# Patient Record
Sex: Female | Born: 1950 | Race: Black or African American | Hispanic: No | Marital: Single
Health system: Southern US, Community
[De-identification: ages and names within clinical notes are randomized; demographics above are authoritative.]

---

## 2004-02-19 ENCOUNTER — Encounter: Payer: Self-pay | Admitting: Anesthesiology

## 2004-03-25 ENCOUNTER — Ambulatory Visit: Payer: Self-pay | Admitting: Nephrology

## 2004-03-30 ENCOUNTER — Ambulatory Visit: Payer: Self-pay | Admitting: Nephrology

## 2004-04-11 ENCOUNTER — Ambulatory Visit: Payer: Self-pay | Admitting: Nephrology

## 2004-04-15 ENCOUNTER — Ambulatory Visit: Payer: Self-pay | Admitting: Nephrology

## 2004-04-20 ENCOUNTER — Ambulatory Visit: Payer: Self-pay | Admitting: Nephrology

## 2004-05-11 ENCOUNTER — Ambulatory Visit: Payer: Self-pay | Admitting: *Deleted

## 2004-06-10 ENCOUNTER — Ambulatory Visit: Payer: Self-pay | Admitting: Internal Medicine

## 2004-06-29 ENCOUNTER — Ambulatory Visit: Payer: Self-pay | Admitting: Nephrology

## 2004-07-11 ENCOUNTER — Ambulatory Visit: Payer: Self-pay | Admitting: Internal Medicine

## 2004-07-27 ENCOUNTER — Ambulatory Visit: Payer: Self-pay | Admitting: Nephrology

## 2004-08-15 ENCOUNTER — Ambulatory Visit: Payer: Self-pay | Admitting: Internal Medicine

## 2004-08-17 ENCOUNTER — Ambulatory Visit: Payer: Self-pay | Admitting: Internal Medicine

## 2004-09-19 ENCOUNTER — Ambulatory Visit: Payer: Self-pay | Admitting: Nephrology

## 2005-02-15 ENCOUNTER — Other Ambulatory Visit: Payer: Self-pay

## 2005-02-15 ENCOUNTER — Emergency Department: Payer: Self-pay | Admitting: General Practice

## 2005-03-20 ENCOUNTER — Ambulatory Visit: Payer: Self-pay | Admitting: Nephrology

## 2005-04-10 ENCOUNTER — Ambulatory Visit: Payer: Self-pay | Admitting: Nephrology

## 2005-07-24 ENCOUNTER — Emergency Department (HOSPITAL_COMMUNITY): Admission: EM | Admit: 2005-07-24 | Discharge: 2005-07-24 | Payer: Self-pay | Admitting: *Deleted

## 2005-08-02 ENCOUNTER — Ambulatory Visit: Payer: Self-pay | Admitting: Nephrology

## 2005-09-12 ENCOUNTER — Ambulatory Visit: Payer: Self-pay | Admitting: Nephrology

## 2006-02-10 ENCOUNTER — Emergency Department: Payer: Self-pay | Admitting: Emergency Medicine

## 2006-12-02 ENCOUNTER — Emergency Department: Payer: Self-pay | Admitting: Emergency Medicine

## 2007-01-27 ENCOUNTER — Ambulatory Visit: Payer: Self-pay | Admitting: Nephrology

## 2007-02-03 ENCOUNTER — Ambulatory Visit: Payer: Self-pay | Admitting: Gastroenterology

## 2007-02-12 ENCOUNTER — Ambulatory Visit: Payer: Self-pay | Admitting: Nephrology

## 2007-07-26 ENCOUNTER — Emergency Department: Payer: Self-pay | Admitting: Internal Medicine

## 2007-08-16 ENCOUNTER — Emergency Department: Payer: Self-pay | Admitting: Emergency Medicine

## 2007-08-21 ENCOUNTER — Ambulatory Visit: Payer: Self-pay | Admitting: Nephrology

## 2007-08-23 ENCOUNTER — Emergency Department: Payer: Self-pay | Admitting: Emergency Medicine

## 2007-09-18 ENCOUNTER — Ambulatory Visit: Payer: Self-pay | Admitting: Nephrology

## 2008-02-10 ENCOUNTER — Ambulatory Visit: Payer: Self-pay | Admitting: Nephrology

## 2008-02-12 ENCOUNTER — Inpatient Hospital Stay: Payer: Self-pay | Admitting: Internal Medicine

## 2008-02-12 ENCOUNTER — Other Ambulatory Visit: Payer: Self-pay

## 2008-02-12 ENCOUNTER — Ambulatory Visit: Payer: Self-pay | Admitting: Cardiovascular Disease

## 2008-02-13 ENCOUNTER — Ambulatory Visit: Payer: Self-pay | Admitting: Cardiovascular Disease

## 2008-03-13 ENCOUNTER — Emergency Department: Payer: Self-pay | Admitting: Emergency Medicine

## 2008-03-14 ENCOUNTER — Emergency Department: Payer: Self-pay | Admitting: Emergency Medicine

## 2008-04-08 ENCOUNTER — Inpatient Hospital Stay: Payer: Self-pay | Admitting: Internal Medicine

## 2008-07-05 ENCOUNTER — Ambulatory Visit: Payer: Self-pay | Admitting: Gastroenterology

## 2008-07-14 ENCOUNTER — Emergency Department: Payer: Self-pay | Admitting: Emergency Medicine

## 2009-01-17 ENCOUNTER — Emergency Department: Payer: Self-pay | Admitting: Emergency Medicine

## 2010-03-16 ENCOUNTER — Ambulatory Visit: Payer: Self-pay | Admitting: Vascular Surgery

## 2010-06-09 ENCOUNTER — Ambulatory Visit: Payer: Self-pay | Admitting: Vascular Surgery

## 2010-07-28 ENCOUNTER — Ambulatory Visit: Payer: Self-pay | Admitting: Nephrology

## 2010-07-28 DIAGNOSIS — I517 Cardiomegaly: Secondary | ICD-10-CM

## 2010-09-22 ENCOUNTER — Emergency Department: Payer: Self-pay | Admitting: Unknown Physician Specialty

## 2010-11-02 ENCOUNTER — Ambulatory Visit: Payer: Self-pay | Admitting: Anesthesiology

## 2010-11-17 ENCOUNTER — Ambulatory Visit: Payer: Self-pay | Admitting: Vascular Surgery

## 2010-11-20 ENCOUNTER — Ambulatory Visit: Payer: Self-pay | Admitting: Anesthesiology

## 2011-06-15 ENCOUNTER — Ambulatory Visit: Payer: Self-pay | Admitting: Vascular Surgery

## 2011-06-15 LAB — POTASSIUM: Potassium: 3.6 mmol/L (ref 3.5–5.1)

## 2011-06-26 ENCOUNTER — Emergency Department: Payer: Self-pay | Admitting: *Deleted

## 2011-06-26 LAB — COMPREHENSIVE METABOLIC PANEL
Albumin: 3.3 g/dL — ABNORMAL LOW (ref 3.4–5.0)
Alkaline Phosphatase: 93 U/L (ref 50–136)
BUN: 24 mg/dL — ABNORMAL HIGH (ref 7–18)
Bilirubin,Total: 0.6 mg/dL (ref 0.2–1.0)
Calcium, Total: 9.2 mg/dL (ref 8.5–10.1)
Chloride: 98 mmol/L (ref 98–107)
Creatinine: 5.08 mg/dL — ABNORMAL HIGH (ref 0.60–1.30)
EGFR (African American): 11 — ABNORMAL LOW
Glucose: 79 mg/dL (ref 65–99)
Osmolality: 281 (ref 275–301)
SGPT (ALT): 6 U/L — ABNORMAL LOW
Sodium: 139 mmol/L (ref 136–145)
Total Protein: 7.6 g/dL (ref 6.4–8.2)

## 2011-06-26 LAB — CBC
HCT: 32.3 % — ABNORMAL LOW (ref 35.0–47.0)
HGB: 10.1 g/dL — ABNORMAL LOW (ref 12.0–16.0)
MCV: 93 fL (ref 80–100)
Platelet: 162 10*3/uL (ref 150–440)
RBC: 3.47 10*6/uL — ABNORMAL LOW (ref 3.80–5.20)
WBC: 5.7 10*3/uL (ref 3.6–11.0)

## 2011-06-26 LAB — TROPONIN I
Troponin-I: 0.52 ng/mL — ABNORMAL HIGH
Troponin-I: 0.5 ng/mL — ABNORMAL HIGH

## 2011-06-26 LAB — CK TOTAL AND CKMB (NOT AT ARMC)
CK, Total: 33 U/L (ref 21–215)
CK-MB: <0.5 ng/mL — ABNORMAL LOW (ref 0.5–3.6)

## 2011-07-11 ENCOUNTER — Emergency Department: Payer: Self-pay | Admitting: Unknown Physician Specialty

## 2011-07-11 LAB — COMPREHENSIVE METABOLIC PANEL
Alkaline Phosphatase: 108 U/L (ref 50–136)
Anion Gap: 12 (ref 7–16)
BUN: 20 mg/dL — ABNORMAL HIGH (ref 7–18)
Bilirubin,Total: 0.6 mg/dL (ref 0.2–1.0)
Calcium, Total: 8 mg/dL — ABNORMAL LOW (ref 8.5–10.1)
Chloride: 100 mmol/L (ref 98–107)
Co2: 32 mmol/L (ref 21–32)
Creatinine: 4.92 mg/dL — ABNORMAL HIGH (ref 0.60–1.30)
EGFR (African American): 12 — ABNORMAL LOW
EGFR (Non-African Amer.): 9 — ABNORMAL LOW
Osmolality: 290 (ref 275–301)
Potassium: 3.4 mmol/L — ABNORMAL LOW (ref 3.5–5.1)
SGOT(AST): 11 U/L — ABNORMAL LOW (ref 15–37)
SGPT (ALT): 8 U/L — ABNORMAL LOW

## 2011-07-11 LAB — CBC
MCHC: 31.2 g/dL — ABNORMAL LOW (ref 32.0–36.0)
Platelet: 131 10*3/uL — ABNORMAL LOW (ref 150–440)
RDW: 16.8 % — ABNORMAL HIGH (ref 11.5–14.5)

## 2011-08-15 ENCOUNTER — Emergency Department: Payer: Self-pay

## 2011-08-20 ENCOUNTER — Observation Stay: Payer: Self-pay | Admitting: Specialist

## 2011-08-20 LAB — CBC WITH DIFFERENTIAL/PLATELET
Basophil %: 0.5 %
Eosinophil #: 0.1 10*3/uL (ref 0.0–0.7)
HCT: 30.4 % — ABNORMAL LOW (ref 35.0–47.0)
HGB: 9.4 g/dL — ABNORMAL LOW (ref 12.0–16.0)
Lymphocyte %: 14.2 %
MCH: 28.5 pg (ref 26.0–34.0)
MCHC: 30.9 g/dL — ABNORMAL LOW (ref 32.0–36.0)
MCV: 92 fL (ref 80–100)
Monocyte #: 0.5 10*3/uL (ref 0.0–0.7)
Monocyte %: 8.6 %
Neutrophil %: 74.7 %
RBC: 3.3 10*6/uL — ABNORMAL LOW (ref 3.80–5.20)
RDW: 16.8 % — ABNORMAL HIGH (ref 11.5–14.5)
WBC: 5.5 10*3/uL (ref 3.6–11.0)

## 2011-08-20 LAB — COMPREHENSIVE METABOLIC PANEL
Albumin: 3.5 g/dL (ref 3.4–5.0)
Alkaline Phosphatase: 118 U/L (ref 50–136)
Anion Gap: 10 (ref 7–16)
BUN: 33 mg/dL — ABNORMAL HIGH (ref 7–18)
Bilirubin,Total: 1 mg/dL (ref 0.2–1.0)
Calcium, Total: 9.5 mg/dL (ref 8.5–10.1)
Creatinine: 5.47 mg/dL — ABNORMAL HIGH (ref 0.60–1.30)
EGFR (Non-African Amer.): 8 — ABNORMAL LOW
Glucose: 182 mg/dL — ABNORMAL HIGH (ref 65–99)
Potassium: 4.2 mmol/L (ref 3.5–5.1)
SGPT (ALT): 13 U/L
Total Protein: 7.9 g/dL (ref 6.4–8.2)

## 2011-08-20 LAB — IRON AND TIBC
Iron Bind.Cap.(Total): 121 ug/dL — ABNORMAL LOW (ref 250–450)
Iron Saturation: 45 %

## 2011-08-20 LAB — FERRITIN: Ferritin (ARMC): 1807 ng/mL — ABNORMAL HIGH (ref 8–388)

## 2011-08-21 LAB — CBC WITH DIFFERENTIAL/PLATELET
Basophil #: 0 10*3/uL (ref 0.0–0.1)
Basophil %: 0.1 %
Eosinophil %: 0.9 %
HCT: 27.3 % — ABNORMAL LOW (ref 35.0–47.0)
HGB: 8.6 g/dL — ABNORMAL LOW (ref 12.0–16.0)
Lymphocyte #: 0.8 10*3/uL — ABNORMAL LOW (ref 1.0–3.6)
MCH: 29.1 pg (ref 26.0–34.0)
MCV: 93 fL (ref 80–100)
Monocyte #: 0.4 10*3/uL (ref 0.0–0.7)
Monocyte %: 7.2 %
Neutrophil %: 76.3 %
RBC: 2.95 10*6/uL — ABNORMAL LOW (ref 3.80–5.20)
RDW: 17.8 % — ABNORMAL HIGH (ref 11.5–14.5)
WBC: 5.2 10*3/uL (ref 3.6–11.0)

## 2011-08-21 LAB — RENAL FUNCTION PANEL
BUN: 43 mg/dL — ABNORMAL HIGH (ref 7–18)
Chloride: 97 mmol/L — ABNORMAL LOW (ref 98–107)
Creatinine: 6.93 mg/dL — ABNORMAL HIGH (ref 0.60–1.30)
EGFR (Non-African Amer.): 6 — ABNORMAL LOW
Glucose: 157 mg/dL — ABNORMAL HIGH (ref 65–99)

## 2011-08-25 ENCOUNTER — Ambulatory Visit: Payer: Self-pay | Admitting: Vascular Surgery

## 2011-08-25 LAB — POTASSIUM: Potassium: 5 mmol/L (ref 3.5–5.1)

## 2011-11-05 ENCOUNTER — Ambulatory Visit: Payer: Self-pay | Admitting: Vascular Surgery

## 2012-01-15 ENCOUNTER — Inpatient Hospital Stay: Payer: Self-pay | Admitting: Internal Medicine

## 2012-01-15 LAB — COMPREHENSIVE METABOLIC PANEL
Alkaline Phosphatase: 98 U/L (ref 50–136)
Anion Gap: 11 (ref 7–16)
BUN: 52 mg/dL — ABNORMAL HIGH (ref 7–18)
Chloride: 98 mmol/L (ref 98–107)
Creatinine: 7.1 mg/dL — ABNORMAL HIGH (ref 0.60–1.30)
EGFR (African American): 7 — ABNORMAL LOW
SGPT (ALT): 16 U/L (ref 12–78)
Total Protein: 8.2 g/dL (ref 6.4–8.2)

## 2012-01-15 LAB — CBC WITH DIFFERENTIAL/PLATELET
Basophil #: 0 10*3/uL (ref 0.0–0.1)
Eosinophil #: 0 10*3/uL (ref 0.0–0.7)
Eosinophil %: 0 %
HGB: 11.6 g/dL — ABNORMAL LOW (ref 12.0–16.0)
Monocyte #: 0.4 x10 3/mm (ref 0.2–0.9)
Neutrophil %: 84.9 %
RBC: 3.92 10*6/uL (ref 3.80–5.20)
WBC: 6.1 10*3/uL (ref 3.6–11.0)

## 2012-01-15 LAB — LIPID PANEL
HDL Cholesterol: 68 mg/dL — ABNORMAL HIGH (ref 40–60)
Ldl Cholesterol, Calc: 64 mg/dL (ref 0–100)
VLDL Cholesterol, Calc: 18 mg/dL (ref 5–40)

## 2012-01-15 LAB — T4, FREE: Free Thyroxine: 1.25 ng/dL (ref 0.76–1.46)

## 2012-01-15 LAB — TSH: Thyroid Stimulating Horm: 0.324 u[IU]/mL — ABNORMAL LOW

## 2012-01-16 DIAGNOSIS — R748 Abnormal levels of other serum enzymes: Secondary | ICD-10-CM

## 2012-01-16 LAB — CBC WITH DIFFERENTIAL/PLATELET
Basophil #: 0 10*3/uL (ref 0.0–0.1)
Basophil %: 0.6 %
Eosinophil #: 0 10*3/uL (ref 0.0–0.7)
Lymphocyte #: 0.5 10*3/uL — ABNORMAL LOW (ref 1.0–3.6)
Lymphocyte %: 8.6 %
MCH: 29.4 pg (ref 26.0–34.0)
MCHC: 31.9 g/dL — ABNORMAL LOW (ref 32.0–36.0)
Monocyte #: 0.5 x10 3/mm (ref 0.2–0.9)
Monocyte %: 7.9 %
Neutrophil %: 82.9 %
RBC: 3.56 10*6/uL — ABNORMAL LOW (ref 3.80–5.20)
RDW: 17.8 % — ABNORMAL HIGH (ref 11.5–14.5)

## 2012-01-16 LAB — TROPONIN I
Troponin-I: 2.15 ng/mL — ABNORMAL HIGH
Troponin-I: 2.4 ng/mL — ABNORMAL HIGH

## 2012-01-16 LAB — COMPREHENSIVE METABOLIC PANEL
Albumin: 2.9 g/dL — ABNORMAL LOW (ref 3.4–5.0)
Alkaline Phosphatase: 85 U/L (ref 50–136)
Anion Gap: 13 (ref 7–16)
BUN: 55 mg/dL — ABNORMAL HIGH (ref 7–18)
Calcium, Total: 9.9 mg/dL (ref 8.5–10.1)
Co2: 25 mmol/L (ref 21–32)
EGFR (Non-African Amer.): 5 — ABNORMAL LOW
Glucose: 173 mg/dL — ABNORMAL HIGH (ref 65–99)
Osmolality: 287 (ref 275–301)
SGOT(AST): 30 U/L (ref 15–37)
SGPT (ALT): 15 U/L (ref 12–78)
Sodium: 134 mmol/L — ABNORMAL LOW (ref 136–145)

## 2012-01-16 LAB — CK TOTAL AND CKMB (NOT AT ARMC): CK-MB: <0.5 ng/mL — ABNORMAL LOW (ref 0.5–3.6)

## 2012-01-17 LAB — CLOSTRIDIUM DIFFICILE BY PCR

## 2012-01-17 LAB — HEMOGLOBIN: HGB: 10.1 g/dL — ABNORMAL LOW (ref 12.0–16.0)

## 2012-01-18 LAB — VANCOMYCIN, TROUGH: Vancomycin, Trough: 19 ug/mL (ref 10–20)

## 2012-01-19 LAB — PHOSPHORUS: Phosphorus: 6.2 mg/dL — ABNORMAL HIGH (ref 2.5–4.9)

## 2012-01-20 LAB — CULTURE, BLOOD (SINGLE)

## 2012-01-21 LAB — RENAL FUNCTION PANEL
Anion Gap: 8 (ref 7–16)
BUN: 35 mg/dL — ABNORMAL HIGH (ref 7–18)
Calcium, Total: 9.9 mg/dL (ref 8.5–10.1)
Chloride: 100 mmol/L (ref 98–107)
EGFR (African American): 8 — ABNORMAL LOW
EGFR (Non-African Amer.): 7 — ABNORMAL LOW
Glucose: 263 mg/dL — ABNORMAL HIGH (ref 65–99)
Osmolality: 293 (ref 275–301)
Sodium: 138 mmol/L (ref 136–145)

## 2012-01-21 LAB — CBC WITH DIFFERENTIAL/PLATELET
Basophil %: 1 %
Eosinophil %: 1.9 %
HCT: 35.1 % (ref 35.0–47.0)
Lymphocyte #: 1.1 10*3/uL (ref 1.0–3.6)
MCH: 29.3 pg (ref 26.0–34.0)
MCV: 95 fL (ref 80–100)
Monocyte #: 0.5 x10 3/mm (ref 0.2–0.9)
Platelet: 201 10*3/uL (ref 150–440)

## 2012-01-21 LAB — CULTURE, BLOOD (SINGLE)

## 2012-01-22 LAB — RENAL FUNCTION PANEL
Albumin: 2.6 g/dL — ABNORMAL LOW (ref 3.4–5.0)
Anion Gap: 8 (ref 7–16)
BUN: 43 mg/dL — ABNORMAL HIGH (ref 7–18)
Calcium, Total: 9.5 mg/dL (ref 8.5–10.1)
Chloride: 102 mmol/L (ref 98–107)
Co2: 27 mmol/L (ref 21–32)
Creatinine: 6.65 mg/dL — ABNORMAL HIGH (ref 0.60–1.30)
EGFR (African American): 7 — ABNORMAL LOW
EGFR (Non-African Amer.): 6 — ABNORMAL LOW
Glucose: 326 mg/dL — ABNORMAL HIGH (ref 65–99)
Osmolality: 297 (ref 275–301)
Phosphorus: 4.7 mg/dL (ref 2.5–4.9)
Potassium: 4.5 mmol/L (ref 3.5–5.1)
Sodium: 137 mmol/L (ref 136–145)

## 2012-01-22 LAB — PHOSPHORUS: Phosphorus: 4.8 mg/dL (ref 2.5–4.9)

## 2012-01-24 ENCOUNTER — Ambulatory Visit: Payer: Self-pay | Admitting: Vascular Surgery

## 2012-01-24 LAB — CBC WITH DIFFERENTIAL/PLATELET
Bands: 1 %
Eosinophil: 1 %
HGB: 10 g/dL — ABNORMAL LOW (ref 12.0–16.0)
Lymphocytes: 25 %
MCH: 29.1 pg (ref 26.0–34.0)
MCHC: 31.3 g/dL — ABNORMAL LOW (ref 32.0–36.0)
MCV: 93 fL (ref 80–100)
Monocytes: 10 %
Myelocyte: 1 %
RBC: 3.45 10*6/uL — ABNORMAL LOW (ref 3.80–5.20)
RDW: 17.4 % — ABNORMAL HIGH (ref 11.5–14.5)
Segmented Neutrophils: 61 %

## 2012-01-24 LAB — PHOSPHORUS: Phosphorus: 5.5 mg/dL — ABNORMAL HIGH (ref 2.5–4.9)

## 2012-01-25 LAB — PHOSPHORUS: Phosphorus: 6.8 mg/dL — ABNORMAL HIGH (ref 2.5–4.9)

## 2012-04-22 ENCOUNTER — Inpatient Hospital Stay: Payer: Self-pay | Admitting: Internal Medicine

## 2012-04-22 LAB — COMPREHENSIVE METABOLIC PANEL
Alkaline Phosphatase: 231 U/L — ABNORMAL HIGH (ref 50–136)
Anion Gap: 10 (ref 7–16)
Bilirubin,Total: 0.8 mg/dL (ref 0.2–1.0)
Calcium, Total: 8.1 mg/dL — ABNORMAL LOW (ref 8.5–10.1)
Chloride: 97 mmol/L — ABNORMAL LOW (ref 98–107)
Co2: 29 mmol/L (ref 21–32)
EGFR (African American): 16 — ABNORMAL LOW
EGFR (Non-African Amer.): 14 — ABNORMAL LOW
Glucose: 91 mg/dL (ref 65–99)
Osmolality: 273 (ref 275–301)
Potassium: 3.2 mmol/L — ABNORMAL LOW (ref 3.5–5.1)
Sodium: 136 mmol/L (ref 136–145)

## 2012-04-22 LAB — CBC
MCHC: 32.1 g/dL (ref 32.0–36.0)
Platelet: 89 10*3/uL — ABNORMAL LOW (ref 150–440)
RDW: 17.6 % — ABNORMAL HIGH (ref 11.5–14.5)
WBC: 8.1 10*3/uL (ref 3.6–11.0)

## 2012-04-22 LAB — LIPASE, BLOOD: Lipase: 156 U/L (ref 73–393)

## 2012-04-23 LAB — CBC WITH DIFFERENTIAL/PLATELET
Basophil #: 0.1 10*3/uL (ref 0.0–0.1)
Basophil %: 0.6 %
Eosinophil #: 0.1 10*3/uL (ref 0.0–0.7)
HCT: 31.6 % — ABNORMAL LOW (ref 35.0–47.0)
HGB: 9.8 g/dL — ABNORMAL LOW (ref 12.0–16.0)
Lymphocyte %: 6.5 %
MCHC: 30.8 g/dL — ABNORMAL LOW (ref 32.0–36.0)
Monocyte #: 0.5 x10 3/mm (ref 0.2–0.9)
WBC: 8.7 10*3/uL (ref 3.6–11.0)

## 2012-04-23 LAB — BASIC METABOLIC PANEL
BUN: 26 mg/dL — ABNORMAL HIGH (ref 7–18)
Calcium, Total: 8.1 mg/dL — ABNORMAL LOW (ref 8.5–10.1)
Chloride: 102 mmol/L (ref 98–107)
Creatinine: 4.19 mg/dL — ABNORMAL HIGH (ref 0.60–1.30)
EGFR (African American): 12 — ABNORMAL LOW
EGFR (Non-African Amer.): 11 — ABNORMAL LOW
Glucose: 127 mg/dL — ABNORMAL HIGH (ref 65–99)
Osmolality: 282 (ref 275–301)
Sodium: 138 mmol/L (ref 136–145)

## 2012-04-23 LAB — PHOSPHORUS: Phosphorus: 4.7 mg/dL (ref 2.5–4.9)

## 2012-04-23 LAB — MAGNESIUM: Magnesium: 2.3 mg/dL

## 2012-04-24 LAB — COMPREHENSIVE METABOLIC PANEL
Albumin: 2.6 g/dL — ABNORMAL LOW (ref 3.4–5.0)
Alkaline Phosphatase: 192 U/L — ABNORMAL HIGH (ref 50–136)
Calcium, Total: 7.6 mg/dL — ABNORMAL LOW (ref 8.5–10.1)
Glucose: 125 mg/dL — ABNORMAL HIGH (ref 65–99)
Potassium: 4.1 mmol/L (ref 3.5–5.1)
SGOT(AST): 97 U/L — ABNORMAL HIGH (ref 15–37)
SGPT (ALT): 80 U/L — ABNORMAL HIGH (ref 12–78)
Total Protein: 7 g/dL (ref 6.4–8.2)

## 2012-04-24 LAB — CANCER ANTIGEN 19-9: CA 19-9: 59 U/mL — ABNORMAL HIGH (ref 0–35)

## 2012-04-24 LAB — CEA: CEA: 3.3 ng/mL (ref 0.0–4.7)

## 2012-04-24 LAB — HEPATIC FUNCTION PANEL A (ARMC): Bilirubin, Direct: 0.2 mg/dL (ref 0.00–0.20)

## 2012-04-24 LAB — PROTIME-INR: Prothrombin Time: 16.7 secs — ABNORMAL HIGH (ref 11.5–14.7)

## 2012-04-24 LAB — LIPASE, BLOOD: Lipase: 150 U/L (ref 73–393)

## 2012-04-25 LAB — CBC WITH DIFFERENTIAL/PLATELET
Basophil %: 0.7 %
Eosinophil #: 0.1 10*3/uL (ref 0.0–0.7)
Eosinophil %: 0.7 %
HCT: 33.3 % — ABNORMAL LOW (ref 35.0–47.0)
HGB: 10.4 g/dL — ABNORMAL LOW (ref 12.0–16.0)
Lymphocyte #: 0.6 10*3/uL — ABNORMAL LOW (ref 1.0–3.6)
Lymphocyte %: 7 %
MCH: 27.6 pg (ref 26.0–34.0)
MCHC: 31.3 g/dL — ABNORMAL LOW (ref 32.0–36.0)
MCV: 88 fL (ref 80–100)
Monocyte %: 5.4 %
Neutrophil #: 7.2 10*3/uL — ABNORMAL HIGH (ref 1.4–6.5)
RBC: 3.77 10*6/uL — ABNORMAL LOW (ref 3.80–5.20)
RDW: 17.6 % — ABNORMAL HIGH (ref 11.5–14.5)
WBC: 8.3 10*3/uL (ref 3.6–11.0)

## 2012-04-25 LAB — HEPATIC FUNCTION PANEL A (ARMC)
Albumin: 2.5 g/dL — ABNORMAL LOW (ref 3.4–5.0)
Bilirubin, Direct: 0.2 mg/dL (ref 0.00–0.20)
Bilirubin,Total: 0.5 mg/dL (ref 0.2–1.0)
SGPT (ALT): 57 U/L (ref 12–78)
Total Protein: 7.1 g/dL (ref 6.4–8.2)

## 2012-04-25 LAB — PROTIME-INR: INR: 1.3

## 2012-04-26 LAB — PROTIME-INR: Prothrombin Time: 16.5 secs — ABNORMAL HIGH (ref 11.5–14.7)

## 2012-04-26 LAB — RENAL FUNCTION PANEL
Anion Gap: 8 (ref 7–16)
Calcium, Total: 7.6 mg/dL — ABNORMAL LOW (ref 8.5–10.1)
Co2: 27 mmol/L (ref 21–32)
Glucose: 168 mg/dL — ABNORMAL HIGH (ref 65–99)
Osmolality: 285 (ref 275–301)
Phosphorus: 3.9 mg/dL (ref 2.5–4.9)
Potassium: 3.7 mmol/L (ref 3.5–5.1)
Sodium: 139 mmol/L (ref 136–145)

## 2012-04-26 LAB — CBC WITH DIFFERENTIAL/PLATELET
Basophil %: 1.2 %
Eosinophil #: 0.1 10*3/uL (ref 0.0–0.7)
Eosinophil %: 2 %
HCT: 31.7 % — ABNORMAL LOW (ref 35.0–47.0)
HGB: 9.8 g/dL — ABNORMAL LOW (ref 12.0–16.0)
Lymphocyte %: 13.9 %
MCHC: 31 g/dL — ABNORMAL LOW (ref 32.0–36.0)
Monocyte %: 6.3 %
Neutrophil #: 4.3 10*3/uL (ref 1.4–6.5)
Neutrophil %: 76.6 %
RBC: 3.62 10*6/uL — ABNORMAL LOW (ref 3.80–5.20)
RDW: 17.7 % — ABNORMAL HIGH (ref 11.5–14.5)

## 2012-04-27 LAB — CLOSTRIDIUM DIFFICILE BY PCR

## 2012-04-28 LAB — CULTURE, BLOOD (SINGLE)

## 2012-05-03 ENCOUNTER — Emergency Department: Payer: Self-pay | Admitting: Emergency Medicine

## 2012-05-03 LAB — CBC
HCT: 31.2 % — ABNORMAL LOW (ref 35.0–47.0)
HGB: 9.6 g/dL — ABNORMAL LOW (ref 12.0–16.0)
MCHC: 30.8 g/dL — ABNORMAL LOW (ref 32.0–36.0)
MCV: 89 fL (ref 80–100)
RBC: 3.52 10*6/uL — ABNORMAL LOW (ref 3.80–5.20)
WBC: 8.1 10*3/uL (ref 3.6–11.0)

## 2012-05-03 LAB — COMPREHENSIVE METABOLIC PANEL
Albumin: 2.7 g/dL — ABNORMAL LOW (ref 3.4–5.0)
Alkaline Phosphatase: 156 U/L — ABNORMAL HIGH (ref 50–136)
Anion Gap: 12 (ref 7–16)
BUN: 19 mg/dL — ABNORMAL HIGH (ref 7–18)
Bilirubin,Total: 0.5 mg/dL (ref 0.2–1.0)
Calcium, Total: 8.2 mg/dL — ABNORMAL LOW (ref 8.5–10.1)
Co2: 23 mmol/L (ref 21–32)
Creatinine: 4.14 mg/dL — ABNORMAL HIGH (ref 0.60–1.30)
EGFR (Non-African Amer.): 11 — ABNORMAL LOW
Osmolality: 280 (ref 275–301)
Potassium: 4.3 mmol/L (ref 3.5–5.1)
Sodium: 139 mmol/L (ref 136–145)
Total Protein: 7.7 g/dL (ref 6.4–8.2)

## 2012-05-03 LAB — TROPONIN I: Troponin-I: 0.25 ng/mL — ABNORMAL HIGH

## 2012-05-04 ENCOUNTER — Inpatient Hospital Stay: Payer: Self-pay | Admitting: Internal Medicine

## 2012-05-04 LAB — COMPREHENSIVE METABOLIC PANEL
Albumin: 2.7 g/dL — ABNORMAL LOW (ref 3.4–5.0)
Anion Gap: 9 (ref 7–16)
BUN: 35 mg/dL — ABNORMAL HIGH (ref 7–18)
Bilirubin,Total: 0.3 mg/dL (ref 0.2–1.0)
Chloride: 108 mmol/L — ABNORMAL HIGH (ref 98–107)
Creatinine: 6.3 mg/dL — ABNORMAL HIGH (ref 0.60–1.30)
EGFR (Non-African Amer.): 6 — ABNORMAL LOW
Glucose: 132 mg/dL — ABNORMAL HIGH (ref 65–99)
Osmolality: 287 (ref 275–301)
Potassium: 5.3 mmol/L — ABNORMAL HIGH (ref 3.5–5.1)
Sodium: 139 mmol/L (ref 136–145)

## 2012-05-04 LAB — CBC
HCT: 31.3 % — ABNORMAL LOW
HGB: 9.7 g/dL — ABNORMAL LOW
MCH: 27.4 pg
MCHC: 31 g/dL — ABNORMAL LOW
MCV: 88 fL
Platelet: 128 10*3/uL — ABNORMAL LOW
RBC: 3.54 X10 6/mm 3 — ABNORMAL LOW
RDW: 18.5 % — ABNORMAL HIGH
WBC: 8.4 10*3/uL

## 2012-05-05 LAB — CULTURE, BLOOD (SINGLE)

## 2012-05-05 LAB — HEMOGLOBIN A1C: Hemoglobin A1C: 6.8 % — ABNORMAL HIGH (ref 4.2–6.3)

## 2012-05-07 LAB — CLOSTRIDIUM DIFFICILE BY PCR

## 2012-05-08 LAB — BASIC METABOLIC PANEL
Anion Gap: 9 (ref 7–16)
Calcium, Total: 7.3 mg/dL — ABNORMAL LOW (ref 8.5–10.1)
Chloride: 105 mmol/L (ref 98–107)
Co2: 22 mmol/L (ref 21–32)
EGFR (African American): 6 — ABNORMAL LOW
EGFR (Non-African Amer.): 5 — ABNORMAL LOW
Glucose: 131 mg/dL — ABNORMAL HIGH (ref 65–99)
Osmolality: 286 (ref 275–301)
Sodium: 136 mmol/L (ref 136–145)

## 2012-05-08 LAB — PHOSPHORUS: Phosphorus: 5.7 mg/dL — ABNORMAL HIGH (ref 2.5–4.9)

## 2012-05-10 LAB — CULTURE, BLOOD (SINGLE)

## 2012-05-11 LAB — CULTURE, BLOOD (SINGLE)

## 2012-06-02 ENCOUNTER — Ambulatory Visit: Payer: Self-pay | Admitting: Vascular Surgery

## 2012-06-05 LAB — AEROBIC CULTURE

## 2012-06-10 ENCOUNTER — Ambulatory Visit: Payer: Self-pay | Admitting: Vascular Surgery

## 2012-06-11 ENCOUNTER — Emergency Department: Payer: Self-pay

## 2012-07-04 ENCOUNTER — Ambulatory Visit: Payer: Self-pay | Admitting: Hematology and Oncology

## 2012-07-15 ENCOUNTER — Emergency Department: Payer: Self-pay | Admitting: Emergency Medicine

## 2012-07-15 LAB — CBC
HGB: 11.4 g/dL — ABNORMAL LOW (ref 12.0–16.0)
MCH: 24.4 pg — ABNORMAL LOW (ref 26.0–34.0)
MCHC: 29.5 g/dL — ABNORMAL LOW (ref 32.0–36.0)
MCV: 83 fL (ref 80–100)
RBC: 4.66 10*6/uL (ref 3.80–5.20)
RDW: 22.7 % — ABNORMAL HIGH (ref 11.5–14.5)
WBC: 6.4 10*3/uL (ref 3.6–11.0)

## 2012-07-15 LAB — COMPREHENSIVE METABOLIC PANEL
Anion Gap: 11 (ref 7–16)
BUN: 48 mg/dL — ABNORMAL HIGH (ref 7–18)
Bilirubin,Total: 0.5 mg/dL (ref 0.2–1.0)
Calcium, Total: 8.1 mg/dL — ABNORMAL LOW (ref 8.5–10.1)
Chloride: 104 mmol/L (ref 98–107)
Co2: 23 mmol/L (ref 21–32)
Creatinine: 5.73 mg/dL — ABNORMAL HIGH (ref 0.60–1.30)
EGFR (African American): 8 — ABNORMAL LOW
EGFR (Non-African Amer.): 7 — ABNORMAL LOW
Osmolality: 289 (ref 275–301)
Potassium: 4.6 mmol/L (ref 3.5–5.1)
SGOT(AST): 13 U/L — ABNORMAL LOW (ref 15–37)
SGPT (ALT): <6 U/L — ABNORMAL LOW (ref 12–78)

## 2012-07-19 ENCOUNTER — Emergency Department: Payer: Self-pay | Admitting: Emergency Medicine

## 2012-07-19 ENCOUNTER — Ambulatory Visit: Payer: Self-pay | Admitting: Internal Medicine

## 2012-07-19 LAB — CBC
HGB: 9.9 g/dL — ABNORMAL LOW (ref 12.0–16.0)
MCHC: 29 g/dL — ABNORMAL LOW (ref 32.0–36.0)
RBC: 4.15 10*6/uL (ref 3.80–5.20)
RDW: 23 % — ABNORMAL HIGH (ref 11.5–14.5)

## 2012-07-19 LAB — COMPREHENSIVE METABOLIC PANEL
Bilirubin,Total: 0.6 mg/dL (ref 0.2–1.0)
Calcium, Total: 9 mg/dL (ref 8.5–10.1)
Co2: 27 mmol/L (ref 21–32)
Creatinine: 4.76 mg/dL — ABNORMAL HIGH (ref 0.60–1.30)
EGFR (African American): 11 — ABNORMAL LOW
EGFR (Non-African Amer.): 9 — ABNORMAL LOW
Glucose: 112 mg/dL — ABNORMAL HIGH (ref 65–99)
Osmolality: 294 (ref 275–301)
Potassium: 3.8 mmol/L (ref 3.5–5.1)
SGOT(AST): 15 U/L (ref 15–37)
SGPT (ALT): 7 U/L — ABNORMAL LOW (ref 12–78)
Sodium: 143 mmol/L (ref 136–145)
Total Protein: 7.7 g/dL (ref 6.4–8.2)

## 2012-07-23 ENCOUNTER — Emergency Department: Payer: Self-pay | Admitting: Internal Medicine

## 2012-07-23 LAB — CBC
MCV: 80 fL (ref 80–100)
Platelet: 166 10*3/uL (ref 150–440)
RBC: 4.06 10*6/uL (ref 3.80–5.20)
RDW: 22.4 % — ABNORMAL HIGH (ref 11.5–14.5)
WBC: 13.1 10*3/uL — ABNORMAL HIGH (ref 3.6–11.0)

## 2012-07-23 LAB — COMPREHENSIVE METABOLIC PANEL
Albumin: 1.9 g/dL — ABNORMAL LOW (ref 3.4–5.0)
Anion Gap: 7 (ref 7–16)
BUN: 46 mg/dL — ABNORMAL HIGH (ref 7–18)
Bilirubin,Total: 0.5 mg/dL (ref 0.2–1.0)
Calcium, Total: 8.5 mg/dL (ref 8.5–10.1)
Chloride: 103 mmol/L (ref 98–107)
Co2: 27 mmol/L (ref 21–32)
Creatinine: 5.93 mg/dL — ABNORMAL HIGH (ref 0.60–1.30)
EGFR (African American): 8 — ABNORMAL LOW
EGFR (Non-African Amer.): 7 — ABNORMAL LOW
SGPT (ALT): 8 U/L — ABNORMAL LOW (ref 12–78)
Sodium: 137 mmol/L (ref 136–145)
Total Protein: 6.8 g/dL (ref 6.4–8.2)

## 2012-07-25 ENCOUNTER — Inpatient Hospital Stay: Payer: Self-pay | Admitting: Internal Medicine

## 2012-07-25 LAB — BASIC METABOLIC PANEL
BUN: 20 mg/dL — ABNORMAL HIGH (ref 7–18)
Calcium, Total: 8.8 mg/dL (ref 8.5–10.1)
Co2: 27 mmol/L (ref 21–32)
Creatinine: 3.35 mg/dL — ABNORMAL HIGH (ref 0.60–1.30)
EGFR (African American): 16 — ABNORMAL LOW
EGFR (Non-African Amer.): 14 — ABNORMAL LOW
Osmolality: 276 (ref 275–301)
Potassium: 4.7 mmol/L (ref 3.5–5.1)
Sodium: 137 mmol/L (ref 136–145)

## 2012-07-25 LAB — CBC
HCT: 34.3 % — ABNORMAL LOW (ref 35.0–47.0)
MCHC: 28.4 g/dL — ABNORMAL LOW (ref 32.0–36.0)
RBC: 4.27 10*6/uL (ref 3.80–5.20)
RDW: 23.1 % — ABNORMAL HIGH (ref 11.5–14.5)
WBC: 27.2 10*3/uL — ABNORMAL HIGH (ref 3.6–11.0)

## 2012-07-25 LAB — HEMOGLOBIN A1C: Hemoglobin A1C: 4.8 % (ref 4.2–6.3)

## 2012-07-25 LAB — CULTURE, BLOOD (SINGLE)

## 2012-07-26 ENCOUNTER — Ambulatory Visit: Payer: Self-pay | Admitting: Orthopedic Surgery

## 2012-07-26 LAB — CBC WITH DIFFERENTIAL/PLATELET
Eosinophil #: 0 10*3/uL (ref 0.0–0.7)
Eosinophil %: 0 %
HCT: 32.6 % — ABNORMAL LOW (ref 35.0–47.0)
Lymphocyte #: 0.6 10*3/uL — ABNORMAL LOW (ref 1.0–3.6)
MCHC: 28.7 g/dL — ABNORMAL LOW (ref 32.0–36.0)
MCV: 79 fL — ABNORMAL LOW (ref 80–100)
Monocyte %: 1.7 %
Platelet: 199 10*3/uL (ref 150–440)
RBC: 4.11 10*6/uL (ref 3.80–5.20)
RDW: 22.9 % — ABNORMAL HIGH (ref 11.5–14.5)
WBC: 29.2 10*3/uL — ABNORMAL HIGH (ref 3.6–11.0)

## 2012-07-26 LAB — BASIC METABOLIC PANEL
BUN: 26 mg/dL — ABNORMAL HIGH (ref 7–18)
Calcium, Total: 9 mg/dL (ref 8.5–10.1)
Chloride: 103 mmol/L (ref 98–107)
Creatinine: 3.99 mg/dL — ABNORMAL HIGH (ref 0.60–1.30)
EGFR (African American): 13 — ABNORMAL LOW
EGFR (Non-African Amer.): 11 — ABNORMAL LOW
Sodium: 137 mmol/L (ref 136–145)

## 2012-07-26 LAB — WBCS, STOOL

## 2012-07-27 LAB — CBC WITH DIFFERENTIAL/PLATELET
Basophil #: 0.1 10*3/uL (ref 0.0–0.1)
Eosinophil #: 0.1 10*3/uL (ref 0.0–0.7)
HCT: 31.3 % — ABNORMAL LOW (ref 35.0–47.0)
HGB: 9.1 g/dL — ABNORMAL LOW (ref 12.0–16.0)
Lymphocyte %: 2.4 %
MCH: 23.4 pg — ABNORMAL LOW (ref 26.0–34.0)
MCHC: 29.1 g/dL — ABNORMAL LOW (ref 32.0–36.0)
MCV: 81 fL (ref 80–100)
Monocyte #: 0.7 x10 3/mm (ref 0.2–0.9)
Monocyte %: 2.1 %
Platelet: 272 10*3/uL (ref 150–440)
RBC: 3.89 10*6/uL (ref 3.80–5.20)
RDW: 23.4 % — ABNORMAL HIGH (ref 11.5–14.5)

## 2012-07-27 LAB — BASIC METABOLIC PANEL
Anion Gap: 17 — ABNORMAL HIGH (ref 7–16)
BUN: 32 mg/dL — ABNORMAL HIGH (ref 7–18)
Calcium, Total: 9.1 mg/dL (ref 8.5–10.1)
Co2: 16 mmol/L — ABNORMAL LOW (ref 21–32)
EGFR (Non-African Amer.): 10 — ABNORMAL LOW
Glucose: 192 mg/dL — ABNORMAL HIGH (ref 65–99)
Potassium: 3.9 mmol/L (ref 3.5–5.1)

## 2012-07-28 LAB — BASIC METABOLIC PANEL
BUN: 36 mg/dL — ABNORMAL HIGH (ref 7–18)
Calcium, Total: 9 mg/dL (ref 8.5–10.1)
Chloride: 102 mmol/L (ref 98–107)
Creatinine: 5.05 mg/dL — ABNORMAL HIGH (ref 0.60–1.30)
EGFR (African American): 10 — ABNORMAL LOW
EGFR (Non-African Amer.): 8 — ABNORMAL LOW
Potassium: 3.3 mmol/L — ABNORMAL LOW (ref 3.5–5.1)

## 2012-07-28 LAB — CBC WITH DIFFERENTIAL/PLATELET
Basophil #: 0.1 10*3/uL (ref 0.0–0.1)
Basophil %: 0.5 %
Eosinophil #: 0.1 10*3/uL (ref 0.0–0.7)
Eosinophil %: 0.7 %
HCT: 27.2 % — ABNORMAL LOW (ref 35.0–47.0)
HGB: 7.9 g/dL — ABNORMAL LOW (ref 12.0–16.0)
Lymphocyte #: 0.8 10*3/uL — ABNORMAL LOW (ref 1.0–3.6)
Lymphocyte %: 4.7 %
MCHC: 28.9 g/dL — ABNORMAL LOW (ref 32.0–36.0)
Monocyte %: 2.7 %
Neutrophil %: 91.4 %
Platelet: 239 10*3/uL (ref 150–440)
RBC: 3.53 10*6/uL — ABNORMAL LOW (ref 3.80–5.20)
RDW: 22.4 % — ABNORMAL HIGH (ref 11.5–14.5)
WBC: 17.8 10*3/uL — ABNORMAL HIGH (ref 3.6–11.0)

## 2012-07-28 LAB — STOOL CULTURE

## 2012-07-29 LAB — BASIC METABOLIC PANEL
Anion Gap: 10 (ref 7–16)
BUN: 42 mg/dL — ABNORMAL HIGH (ref 7–18)
Calcium, Total: 8.5 mg/dL (ref 8.5–10.1)
Chloride: 102 mmol/L (ref 98–107)
Creatinine: 5.78 mg/dL — ABNORMAL HIGH (ref 0.60–1.30)
EGFR (Non-African Amer.): 7 — ABNORMAL LOW
Glucose: 104 mg/dL — ABNORMAL HIGH (ref 65–99)
Osmolality: 279 (ref 275–301)
Potassium: 3.9 mmol/L (ref 3.5–5.1)
Sodium: 134 mmol/L — ABNORMAL LOW (ref 136–145)

## 2012-07-29 LAB — CBC WITH DIFFERENTIAL/PLATELET
Basophil #: 0 10*3/uL (ref 0.0–0.1)
Eosinophil #: 0.2 10*3/uL (ref 0.0–0.7)
Eosinophil %: 2.1 %
HCT: 22.8 % — ABNORMAL LOW (ref 35.0–47.0)
HGB: 6.9 g/dL — ABNORMAL LOW (ref 12.0–16.0)
Lymphocyte #: 0.6 10*3/uL — ABNORMAL LOW (ref 1.0–3.6)
MCH: 23.4 pg — ABNORMAL LOW (ref 26.0–34.0)
Monocyte #: 0.4 x10 3/mm (ref 0.2–0.9)
Monocyte %: 4.3 %
Neutrophil %: 86.3 %
Platelet: 162 10*3/uL (ref 150–440)
RBC: 2.95 10*6/uL — ABNORMAL LOW (ref 3.80–5.20)
RDW: 23.5 % — ABNORMAL HIGH (ref 11.5–14.5)
WBC: 9.1 10*3/uL (ref 3.6–11.0)

## 2012-07-30 LAB — CBC WITH DIFFERENTIAL/PLATELET
Basophil #: 0.1 10*3/uL (ref 0.0–0.1)
Basophil #: 0.1 10*3/uL (ref 0.0–0.1)
Basophil %: 0.7 %
Basophil %: 0.5 %
Eosinophil %: 1.5 %
HGB: 7.6 g/dL — ABNORMAL LOW (ref 12.0–16.0)
Lymphocyte %: 3.4 %
MCH: 24.3 pg — ABNORMAL LOW (ref 26.0–34.0)
MCH: 24.9 pg — ABNORMAL LOW (ref 26.0–34.0)
MCHC: 31.7 g/dL — ABNORMAL LOW (ref 32.0–36.0)
MCV: 79 fL — ABNORMAL LOW (ref 80–100)
MCV: 79 fL — ABNORMAL LOW (ref 80–100)
Monocyte #: 0.4 x10 3/mm (ref 0.2–0.9)
Monocyte %: 2.5 %
Neutrophil #: 8.4 10*3/uL — ABNORMAL HIGH (ref 1.4–6.5)
Neutrophil #: 13.3 10*3/uL — ABNORMAL HIGH (ref 1.4–6.5)
Neutrophil %: 92.2 %
Platelet: 177 10*3/uL (ref 150–440)
RBC: 3.68 10*6/uL — ABNORMAL LOW (ref 3.80–5.20)
RDW: 22.8 % — ABNORMAL HIGH (ref 11.5–14.5)
WBC: 9.2 10*3/uL (ref 3.6–11.0)

## 2012-07-30 LAB — RENAL FUNCTION PANEL
Albumin: 1.6 g/dL — ABNORMAL LOW (ref 3.4–5.0)
Calcium, Total: 8.4 mg/dL — ABNORMAL LOW (ref 8.5–10.1)
Co2: 18 mmol/L — ABNORMAL LOW (ref 21–32)
Creatinine: 6.01 mg/dL — ABNORMAL HIGH (ref 0.60–1.30)
EGFR (African American): 8 — ABNORMAL LOW
Glucose: 46 mg/dL — ABNORMAL LOW (ref 65–99)
Phosphorus: 4.8 mg/dL (ref 2.5–4.9)
Potassium: 4 mmol/L (ref 3.5–5.1)

## 2012-07-31 LAB — CBC WITH DIFFERENTIAL/PLATELET
Eosinophil %: 1.6 %
HCT: 24.5 % — ABNORMAL LOW (ref 35.0–47.0)
MCH: 23.8 pg — ABNORMAL LOW (ref 26.0–34.0)
MCHC: 30.7 g/dL — ABNORMAL LOW (ref 32.0–36.0)
MCV: 77 fL — ABNORMAL LOW (ref 80–100)
Monocyte #: 0.4 x10 3/mm (ref 0.2–0.9)
Monocyte %: 3.9 %
Neutrophil #: 8.7 10*3/uL — ABNORMAL HIGH (ref 1.4–6.5)
Platelet: 146 10*3/uL — ABNORMAL LOW (ref 150–440)
RBC: 3.17 10*6/uL — ABNORMAL LOW (ref 3.80–5.20)
WBC: 10.2 10*3/uL (ref 3.6–11.0)

## 2012-07-31 LAB — BASIC METABOLIC PANEL
Anion Gap: 6 — ABNORMAL LOW (ref 7–16)
Calcium, Total: 7.9 mg/dL — ABNORMAL LOW (ref 8.5–10.1)
Co2: 28 mmol/L (ref 21–32)
Creatinine: 3.9 mg/dL — ABNORMAL HIGH (ref 0.60–1.30)
EGFR (African American): 13 — ABNORMAL LOW
Glucose: 86 mg/dL (ref 65–99)
Sodium: 138 mmol/L (ref 136–145)

## 2012-07-31 LAB — CULTURE, BLOOD (SINGLE)

## 2012-08-01 ENCOUNTER — Inpatient Hospital Stay
Admission: AD | Admit: 2012-08-01 | Discharge: 2012-08-19 | Disposition: E | Payer: Medicare Other | Source: Ambulatory Visit | Attending: Internal Medicine | Admitting: Internal Medicine

## 2012-08-01 DIAGNOSIS — N186 End stage renal disease: Secondary | ICD-10-CM

## 2012-08-01 DIAGNOSIS — I469 Cardiac arrest, cause unspecified: Secondary | ICD-10-CM

## 2012-08-01 DIAGNOSIS — J9601 Acute respiratory failure with hypoxia: Secondary | ICD-10-CM | POA: Diagnosis not present

## 2012-08-01 LAB — CBC WITH DIFFERENTIAL/PLATELET
Basophil #: 0.1 10*3/uL (ref 0.0–0.1)
Lymphocyte #: 0.7 10*3/uL — ABNORMAL LOW (ref 1.0–3.6)
MCH: 24.5 pg — ABNORMAL LOW (ref 26.0–34.0)
MCHC: 31.6 g/dL — ABNORMAL LOW (ref 32.0–36.0)
Monocyte #: 0.3 x10 3/mm (ref 0.2–0.9)
Monocyte %: 3.4 %
Neutrophil #: 8.9 10*3/uL — ABNORMAL HIGH (ref 1.4–6.5)
Neutrophil %: 87.6 %
Platelet: 154 10*3/uL (ref 150–440)
RDW: 22.9 % — ABNORMAL HIGH (ref 11.5–14.5)
WBC: 10.1 10*3/uL (ref 3.6–11.0)

## 2012-08-01 LAB — BASIC METABOLIC PANEL
BUN: 9 mg/dL (ref 7–18)
Chloride: 104 mmol/L (ref 98–107)
Co2: 30 mmol/L (ref 21–32)
Creatinine: 2.23 mg/dL — ABNORMAL HIGH (ref 0.60–1.30)
EGFR (African American): 26 — ABNORMAL LOW
Potassium: 3.3 mmol/L — ABNORMAL LOW (ref 3.5–5.1)
Sodium: 140 mmol/L (ref 136–145)

## 2012-08-01 LAB — WOUND CULTURE

## 2012-08-02 ENCOUNTER — Ambulatory Visit: Payer: Self-pay | Admitting: Hematology and Oncology

## 2012-08-02 LAB — COMPREHENSIVE METABOLIC PANEL
AST: 8 U/L (ref 0–37)
Albumin: 1.5 g/dL — ABNORMAL LOW (ref 3.5–5.2)
Alkaline Phosphatase: 329 U/L — ABNORMAL HIGH (ref 39–117)
Chloride: 103 mEq/L (ref 96–112)
Potassium: 3.5 mEq/L (ref 3.5–5.1)
Total Bilirubin: 0.3 mg/dL (ref 0.3–1.2)

## 2012-08-02 LAB — PHOSPHORUS: Phosphorus: 3.1 mg/dL (ref 2.3–4.6)

## 2012-08-02 LAB — CBC
HCT: 23.6 % — ABNORMAL LOW (ref 36.0–46.0)
Hemoglobin: 7.4 g/dL — ABNORMAL LOW (ref 12.0–15.0)
MCH: 23.3 pg — ABNORMAL LOW (ref 26.0–34.0)
MCHC: 31.4 g/dL (ref 30.0–36.0)
RBC: 3.17 MIL/uL — ABNORMAL LOW (ref 3.87–5.11)

## 2012-08-02 LAB — SEDIMENTATION RATE: Sed Rate: 70 mm/hr — ABNORMAL HIGH (ref 0–22)

## 2012-08-02 LAB — PROCALCITONIN: Procalcitonin: 4.44 ng/mL

## 2012-08-03 LAB — BASIC METABOLIC PANEL
BUN: 8 mg/dL (ref 6–23)
CO2: 27 mEq/L (ref 19–32)
GFR calc non Af Amer: 18 mL/min — ABNORMAL LOW (ref >90)
Glucose, Bld: 71 mg/dL (ref 70–99)
Potassium: 3.3 mEq/L — ABNORMAL LOW (ref 3.5–5.1)

## 2012-08-03 LAB — CBC
HCT: 23 % — ABNORMAL LOW (ref 36.0–46.0)
Hemoglobin: 7.2 g/dL — ABNORMAL LOW (ref 12.0–15.0)
MCHC: 31.3 g/dL (ref 30.0–36.0)
RBC: 3.08 MIL/uL — ABNORMAL LOW (ref 3.87–5.11)

## 2012-08-04 LAB — RENAL FUNCTION PANEL
BUN: 9 mg/dL (ref 6–23)
CO2: 27 mEq/L (ref 19–32)
Calcium: 8.8 mg/dL (ref 8.4–10.5)
Chloride: 101 mEq/L (ref 96–112)
Creatinine, Ser: 3.06 mg/dL — ABNORMAL HIGH (ref 0.50–1.10)
Glucose, Bld: 148 mg/dL — ABNORMAL HIGH (ref 70–99)

## 2012-08-04 LAB — CBC
HCT: 22.8 % — ABNORMAL LOW (ref 36.0–46.0)
Hemoglobin: 7.3 g/dL — ABNORMAL LOW (ref 12.0–15.0)
MCH: 23.6 pg — ABNORMAL LOW (ref 26.0–34.0)
MCV: 73.8 fL — ABNORMAL LOW (ref 78.0–100.0)
Platelets: 229 10*3/uL (ref 150–400)
RBC: 3.09 MIL/uL — ABNORMAL LOW (ref 3.87–5.11)
WBC: 11.1 10*3/uL — ABNORMAL HIGH (ref 4.0–10.5)

## 2012-08-05 LAB — TYPE AND SCREEN
Antibody Screen: NEGATIVE
Unit division: 0

## 2012-08-05 LAB — HEMOGLOBIN AND HEMATOCRIT, BLOOD: Hemoglobin: 10.6 g/dL — ABNORMAL LOW (ref 12.0–15.0)

## 2012-08-06 LAB — RENAL FUNCTION PANEL
Albumin: 1.4 g/dL — ABNORMAL LOW (ref 3.5–5.2)
Chloride: 91 mEq/L — ABNORMAL LOW (ref 96–112)
GFR calc Af Amer: 27 mL/min — ABNORMAL LOW (ref >90)
Phosphorus: 2.3 mg/dL (ref 2.3–4.6)
Potassium: 5.7 mEq/L — ABNORMAL HIGH (ref 3.5–5.1)
Sodium: 124 mEq/L — ABNORMAL LOW (ref 135–145)

## 2012-08-06 LAB — VANCOMYCIN, TROUGH: Vancomycin Tr: 11.1 ug/mL (ref 10.0–20.0)

## 2012-08-06 LAB — POTASSIUM: Potassium: 4.6 mEq/L (ref 3.5–5.1)

## 2012-08-06 LAB — HEPATITIS B SURFACE ANTIGEN: Hepatitis B Surface Ag: NEGATIVE

## 2012-08-07 ENCOUNTER — Other Ambulatory Visit (HOSPITAL_COMMUNITY): Payer: Self-pay

## 2012-08-07 LAB — URINALYSIS, ROUTINE W REFLEX MICROSCOPIC
Ketones, ur: NEGATIVE mg/dL
Nitrite: POSITIVE — AB
Protein, ur: >300 mg/dL — AB
Urobilinogen, UA: 0.2 mg/dL (ref 0.0–1.0)

## 2012-08-07 LAB — URINE MICROSCOPIC-ADD ON

## 2012-08-07 LAB — CBC WITH DIFFERENTIAL/PLATELET
Basophils Absolute: 0 10*3/uL (ref 0.0–0.1)
Eosinophils Absolute: 0 10*3/uL (ref 0.0–0.7)
Lymphocytes Relative: 7 % — ABNORMAL LOW (ref 12–46)
MCHC: 33.7 g/dL (ref 30.0–36.0)
Neutrophils Relative %: 90 % — ABNORMAL HIGH (ref 43–77)
Platelets: 168 10*3/uL (ref 150–400)
RDW: 24 % — ABNORMAL HIGH (ref 11.5–15.5)

## 2012-08-07 LAB — BASIC METABOLIC PANEL
Calcium: 8.8 mg/dL (ref 8.4–10.5)
GFR calc Af Amer: 29 mL/min — ABNORMAL LOW (ref >90)
GFR calc non Af Amer: 25 mL/min — ABNORMAL LOW (ref >90)
Glucose, Bld: 132 mg/dL — ABNORMAL HIGH (ref 70–99)
Sodium: 135 mEq/L (ref 135–145)

## 2012-08-08 LAB — CBC WITH DIFFERENTIAL/PLATELET
Basophils Relative: 0 % (ref 0–1)
Eosinophils Absolute: 0 10*3/uL (ref 0.0–0.7)
HCT: 27.5 % — ABNORMAL LOW (ref 36.0–46.0)
Hemoglobin: 9.3 g/dL — ABNORMAL LOW (ref 12.0–15.0)
Lymphs Abs: 1.3 10*3/uL (ref 0.7–4.0)
MCH: 25.3 pg — ABNORMAL LOW (ref 26.0–34.0)
MCHC: 33.8 g/dL (ref 30.0–36.0)
Monocytes Absolute: 0.6 10*3/uL (ref 0.1–1.0)
Neutro Abs: 12 10*3/uL — ABNORMAL HIGH (ref 1.7–7.7)
Neutrophils Relative %: 87 % — ABNORMAL HIGH (ref 43–77)

## 2012-08-08 LAB — RENAL FUNCTION PANEL
Albumin: 1.5 g/dL — ABNORMAL LOW (ref 3.5–5.2)
BUN: 17 mg/dL (ref 6–23)
Phosphorus: 3.2 mg/dL (ref 2.3–4.6)
Potassium: 4.8 mEq/L (ref 3.5–5.1)

## 2012-08-10 LAB — CBC WITH DIFFERENTIAL/PLATELET
Eosinophils Absolute: 0.1 10*3/uL (ref 0.0–0.7)
Eosinophils Relative: 1 % (ref 0–5)
HCT: 25.1 % — ABNORMAL LOW (ref 36.0–46.0)
Hemoglobin: 8.3 g/dL — ABNORMAL LOW (ref 12.0–15.0)
Lymphs Abs: 0.9 10*3/uL (ref 0.7–4.0)
MCH: 25 pg — ABNORMAL LOW (ref 26.0–34.0)
MCV: 75.6 fL — ABNORMAL LOW (ref 78.0–100.0)
Monocytes Relative: 6 % (ref 3–12)
RBC: 3.32 MIL/uL — ABNORMAL LOW (ref 3.87–5.11)

## 2012-08-11 LAB — RENAL FUNCTION PANEL
Albumin: 1.5 g/dL — ABNORMAL LOW (ref 3.5–5.2)
Calcium: 9.1 mg/dL (ref 8.4–10.5)
Chloride: 106 mEq/L (ref 96–112)
Creatinine, Ser: 3.1 mg/dL — ABNORMAL HIGH (ref 0.50–1.10)
GFR calc Af Amer: 17 mL/min — ABNORMAL LOW (ref >90)
GFR calc non Af Amer: 15 mL/min — ABNORMAL LOW (ref >90)
Sodium: 143 mEq/L (ref 135–145)

## 2012-08-11 LAB — CBC WITH DIFFERENTIAL/PLATELET
Basophils Absolute: 0 10*3/uL (ref 0.0–0.1)
Basophils Relative: 0 % (ref 0–1)
Eosinophils Absolute: 0 10*3/uL (ref 0.0–0.7)
HCT: 26.5 % — ABNORMAL LOW (ref 36.0–46.0)
Lymphocytes Relative: 9 % — ABNORMAL LOW (ref 12–46)
MCH: 24.6 pg — ABNORMAL LOW (ref 26.0–34.0)
MCHC: 32.1 g/dL (ref 30.0–36.0)
MCV: 76.8 fL — ABNORMAL LOW (ref 78.0–100.0)
Monocytes Relative: 7 % (ref 3–12)
Neutrophils Relative %: 84 % — ABNORMAL HIGH (ref 43–77)

## 2012-08-11 LAB — LIPID PANEL: LDL Cholesterol: 37 mg/dL (ref 0–99)

## 2012-08-11 LAB — SEDIMENTATION RATE: Sed Rate: 61 mm/hr — ABNORMAL HIGH (ref 0–22)

## 2012-08-11 LAB — HEMOGLOBIN A1C: Mean Plasma Glucose: 108 mg/dL (ref <117)

## 2012-08-12 LAB — URINE CULTURE

## 2012-08-13 LAB — RENAL FUNCTION PANEL
BUN: 14 mg/dL (ref 6–23)
CO2: 29 mEq/L (ref 19–32)
Chloride: 106 mEq/L (ref 96–112)
Glucose, Bld: 95 mg/dL (ref 70–99)
Potassium: 3.9 mEq/L (ref 3.5–5.1)

## 2012-08-13 LAB — CBC
HCT: 24.5 % — ABNORMAL LOW (ref 36.0–46.0)
Hemoglobin: 7.8 g/dL — ABNORMAL LOW (ref 12.0–15.0)
WBC: 8.1 10*3/uL (ref 4.0–10.5)

## 2012-08-13 LAB — CULTURE, BLOOD (ROUTINE X 2)

## 2012-08-14 ENCOUNTER — Other Ambulatory Visit (HOSPITAL_COMMUNITY): Payer: Self-pay

## 2012-08-14 DIAGNOSIS — J9600 Acute respiratory failure, unspecified whether with hypoxia or hypercapnia: Secondary | ICD-10-CM

## 2012-08-14 DIAGNOSIS — I469 Cardiac arrest, cause unspecified: Secondary | ICD-10-CM | POA: Diagnosis not present

## 2012-08-14 DIAGNOSIS — N186 End stage renal disease: Secondary | ICD-10-CM

## 2012-08-14 DIAGNOSIS — J9601 Acute respiratory failure with hypoxia: Secondary | ICD-10-CM | POA: Diagnosis not present

## 2012-08-14 LAB — BLOOD GAS, ARTERIAL
FIO2: 1 %
O2 Saturation: 99.4 %
pCO2 arterial: 41.5 mmHg (ref 35.0–45.0)
pH, Arterial: 7.238 — ABNORMAL LOW (ref 7.350–7.450)
pO2, Arterial: 163 mmHg — ABNORMAL HIGH (ref 80.0–100.0)

## 2012-08-14 LAB — CBC WITH DIFFERENTIAL/PLATELET
Eosinophils Absolute: 0 10*3/uL (ref 0.0–0.7)
Eosinophils Relative: 0 % (ref 0–5)
MCH: 24.8 pg — ABNORMAL LOW (ref 26.0–34.0)
Monocytes Absolute: 0.9 10*3/uL (ref 0.1–1.0)
Neutrophils Relative %: 38 % — ABNORMAL LOW (ref 43–77)
Platelets: 142 10*3/uL — ABNORMAL LOW (ref 150–400)
RBC: 3.31 MIL/uL — ABNORMAL LOW (ref 3.87–5.11)
WBC: 11.3 10*3/uL — ABNORMAL HIGH (ref 4.0–10.5)

## 2012-08-14 LAB — CULTURE, BLOOD (ROUTINE X 2): Culture: NO GROWTH

## 2012-08-15 LAB — POCT I-STAT, CHEM 8
Calcium, Ion: 1.37 mmol/L — ABNORMAL HIGH (ref 1.13–1.30)
Creatinine, Ser: 1.4 mg/dL — ABNORMAL HIGH (ref 0.50–1.10)
Glucose, Bld: 61 mg/dL — ABNORMAL LOW (ref 70–99)
Hemoglobin: 9.5 g/dL — ABNORMAL LOW (ref 12.0–15.0)
Sodium: 143 mEq/L (ref 135–145)
TCO2: 26 mmol/L (ref 0–100)

## 2012-08-19 ENCOUNTER — Ambulatory Visit: Payer: Self-pay | Admitting: Internal Medicine

## 2012-08-19 NOTE — Consult Note (Signed)
PULMONARY  / CRITICAL CARE MEDICINE  Name: Anita Fleming MRN: [id number] DOB: July 12, 1950    ADMISSION DATE:  12-Aug-2012 CONSULTATION DATE:3/27  REFERRING MD :  [facility] PRIMARY SERVICE:  [facility]  CHIEF COMPLAINT:  Post code  SIGNIFICANT EVENTS / STUDIES:  3/27 cardiac arrest 3/27 DNR comfort care  LINES / TUBES: Rt fem hd cath>> Left fem cvl>>  CULTURES: dnr  ANTIBIOTICS: dnr  HISTORY OF PRESENT ILLNESS:   62 yo aaf with a plethora of health issues coupled with FTT . She was treated at [facility] for repeated admits for infections , the lst on 3/7 with necrotizing fascitis with multiple species identified including MRSA. Gaping rt thigh wound and increased skin breakdown on left high. She is ESRD an had rt fem hd cath placed due to multiplied failed AV grafts. DC from [facility] 3-14 to [facility]. On 3-27 am she had cardiac arrest and required intubation and pressors support.  Short down time <20mins with ROSC,brief episode of VT, On our arrival Bp 136/80 with bounding femoral pulses, on amio drip  PAST MEDICAL HISTORY :  ESRD PVD post bilateral le amputations DM CAD Necrotizing fascitis Post cardia arrest 3/27  Prior to Admission   Rewiewed   Allergies: Cozaar, zocor, cortisone FAMILY HISTORY:  No family history on file. SOCIAL HISTORY:  has no tobacco, alcohol, and drug history on file.  REVIEW OF SYSTEMS:   Unable to obtain since intubated  SUBJECTIVE:   VITAL SIGNS:   Vital signs reviewed. Abnormal values will appear under impression plan section.    PHYSICAL EXAMINATION: General:  Frail, sickly AAF post cardiac arrest Neuro:  No follows commands HEENT:  Ott>vent Neck:  No jvd Cardiovascular:  hsir hr 132 bp 130/76 post epi .25 mg Lungs:  Decreased in bases Abdomen:  soft Musculoskeletal:  Rt bka, left aka. Skin:  Rt thigh necrotic area with vac,  Left thigh with old wounds   Recent Labs Lab 08/08/12 0600 08/11/12 0600 08/13/12 0435  NA 140 143 144  K 4.8 4.0 3.9  CL  103 106 106  CO2 27 26 29   BUN 17 21 14   CREATININE 2.73* 3.10* 2.55*  GLUCOSE 125* 128* 95    Recent Labs Lab 08/11/12 0600 08/13/12 0435 07/28/2012 0944  HGB 8.5* 7.8* 8.2*  HCT 26.5* 24.5* 27.6*  WBC 9.3 8.1 11.3*  PLT 153 162 PENDING   Dg Chest Port 1 View  08/16/2012  *RADIOLOGY REPORT*  Clinical Data: Status post intubation.  PORTABLE CHEST - 1 VIEW  Comparison: Chest 08/07/2012.  Findings: Endotracheal tube is in place with the tip 1.2 cm above the carina.  The tube could be withdrawn 2 cm for better positioning.  Heart size is upper normal with bilateral predominantly perihilar opacities likely due to pulmonary edema. Zoll pad is noted.  No pneumothorax.  Postoperative change right shoulder and vascular stent are noted.  IMPRESSION:  1.  Tip of ET tube is 1.2 cm above the carina.  The tube could be withdrawn 2 cm for better positioning. 2.  Bilateral airspace opacities most consistent with edema.   Original Report Authenticated By: Holley Dexter, M.D.     ASSESSMENT / PLAN:  Post Cardiac arrest. Acute respiratory failure ESRD Plan: 62 yo aaf with a plethora of health issues coupled with FTT . She was treated at [facility] for repeated admits for infections , the lst on 3/7 with necrotizing fascitis with multiple species identified including MRSA. Gaping rt thigh wound and increased skin breakdown on  left high. She is ESRD an had rt fem hd cath placed due to multiplied failed AV grafts. DC from [facility] 3-14 to [facility]. On 3-27 she had cardiac arrest and required intubation and pressors support. Due to hopelessness of condition, family  Was contacted and she was change to DNR and full comfort care.  Would ct amio drip until family arrives Use morphine liberally as needed for comfort. Withdrawal of life support eventually , if family so desires  PCCM will sign off.   Cyril Mourning MD. Tonny Bollman. [facility] Pager [phone] If no response call [phone]

## 2012-08-19 DEATH — deceased

## 2013-05-23 IMAGING — XA IR VASCULAR PROCEDURE
10 series · 12 of 12 positions shown · IV contrast (IODINE)
Comparison: none

[Series 1: venogram · 1 of 1 slices shown (1 of 3)]
[im 1/1]
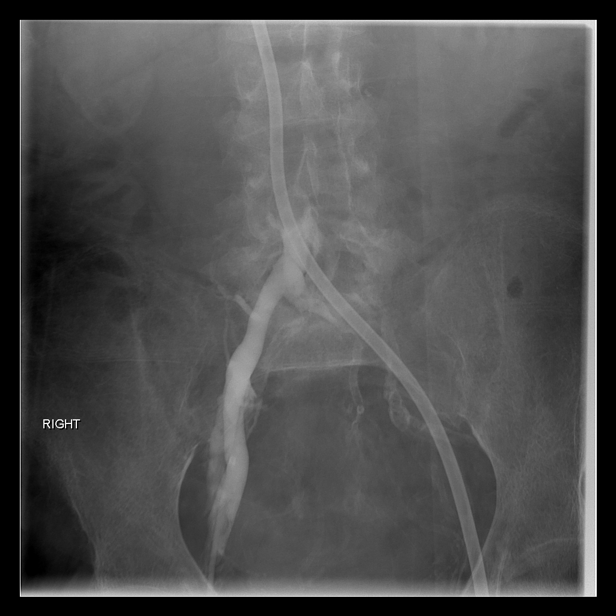

[Series 2: venogram · 2 of 2 slices shown (2 of 3)]
[im 1/2]
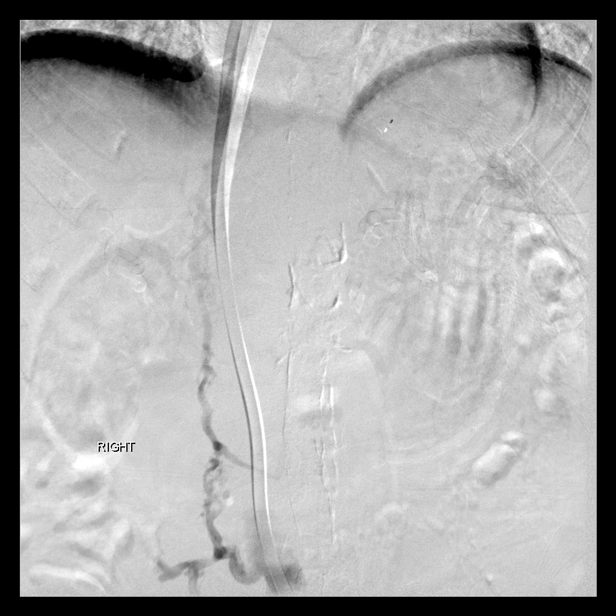
[im 2/2]
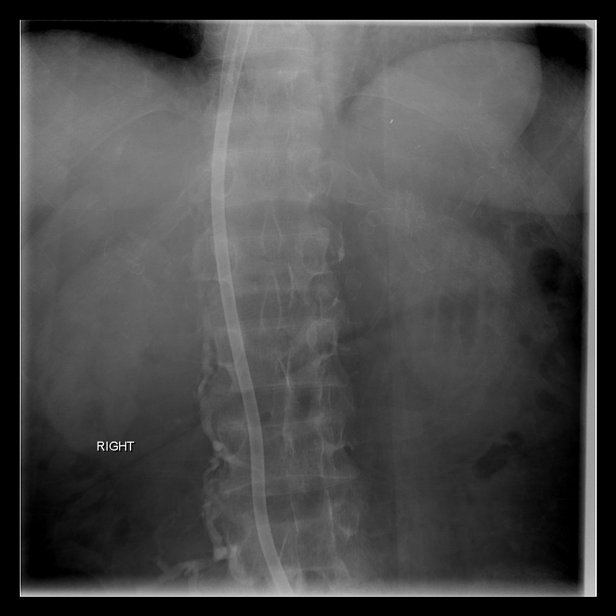

[Series 3: venogram · 2 of 2 slices shown (3 of 3)]
[im 1/2]
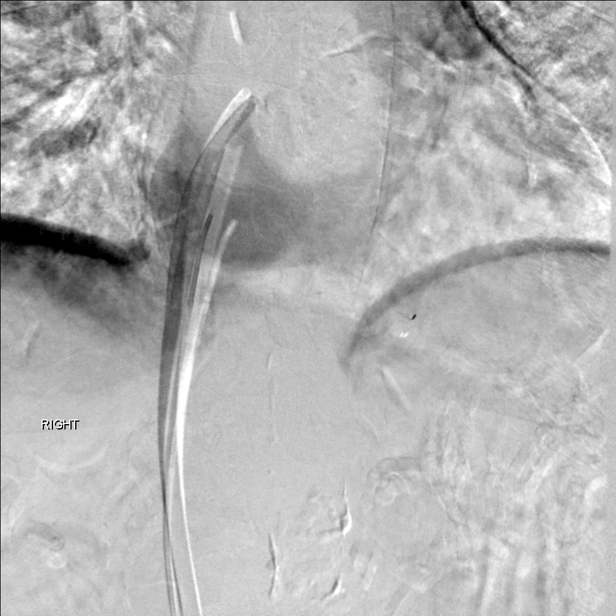
[im 2/2]
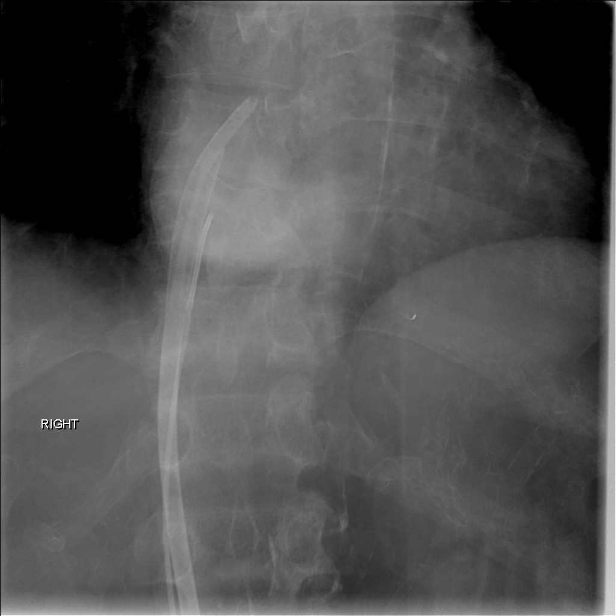

[Series 4: fl - angio · 1 of 1 slices shown (1 of 6)]
[im 1/1]
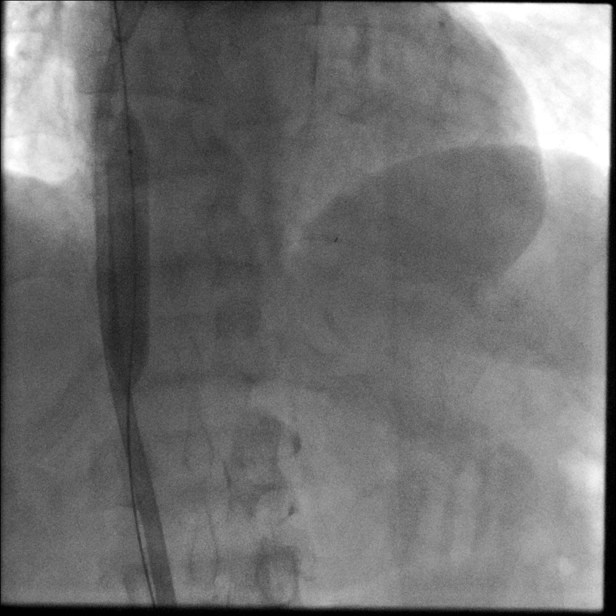

[Series 5: fl - angio · 1 of 1 slices shown (2 of 6)]
[im 1/1]
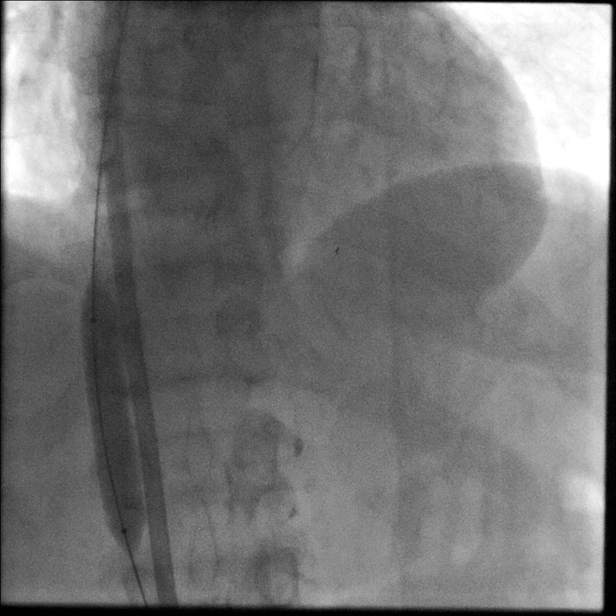

[Series 6: fl - angio · 1 of 1 slices shown (3 of 6)]
[im 1/1]
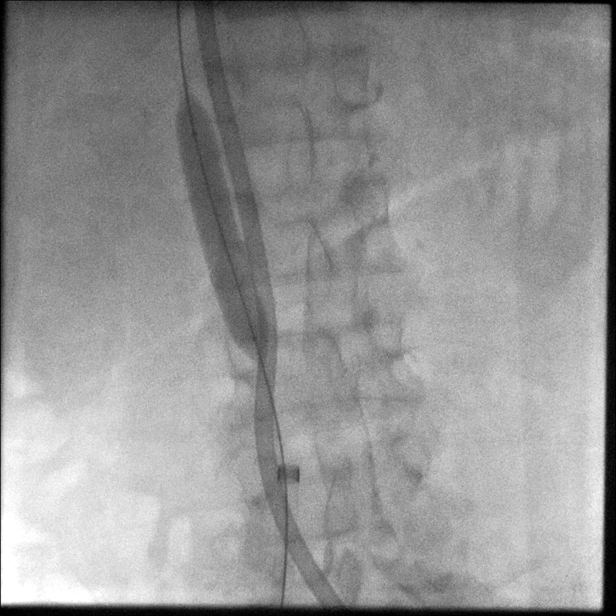

[Series 7: fl - angio · 1 of 1 slices shown (4 of 6)]
[im 1/1]
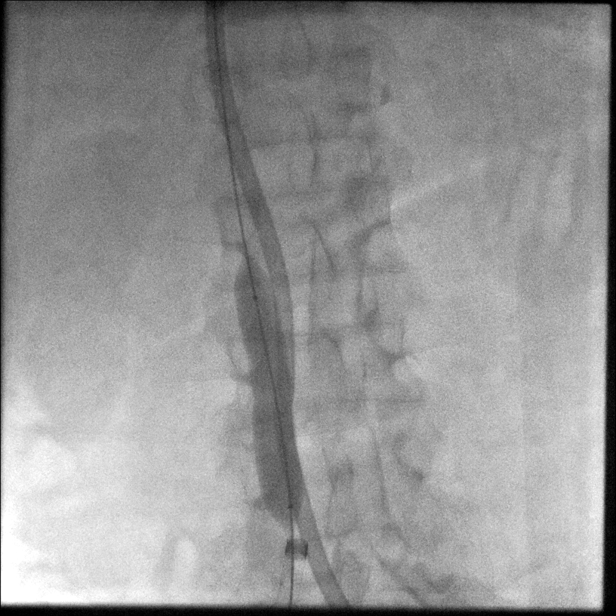

[Series 8: fl - angio · 1 of 1 slices shown (5 of 6)]
[im 1/1]
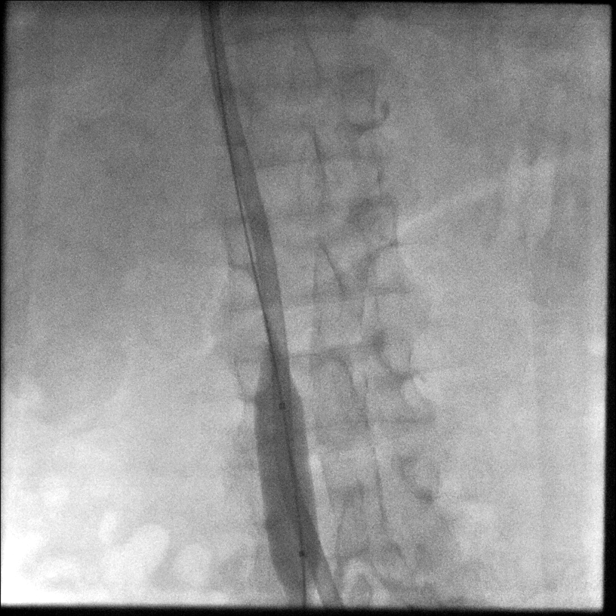

[Series 9: fl - angio · 1 of 1 slices shown (6 of 6)]
[im 1/1]
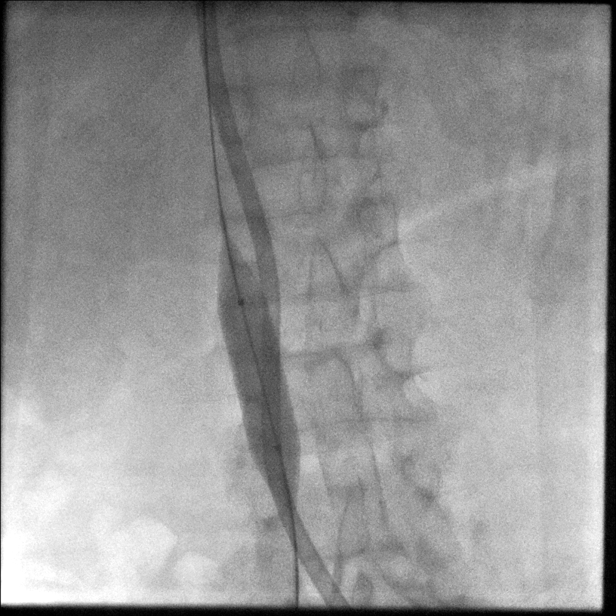

[Series 10: single · 1 of 1 slices shown]
[im 1/1]
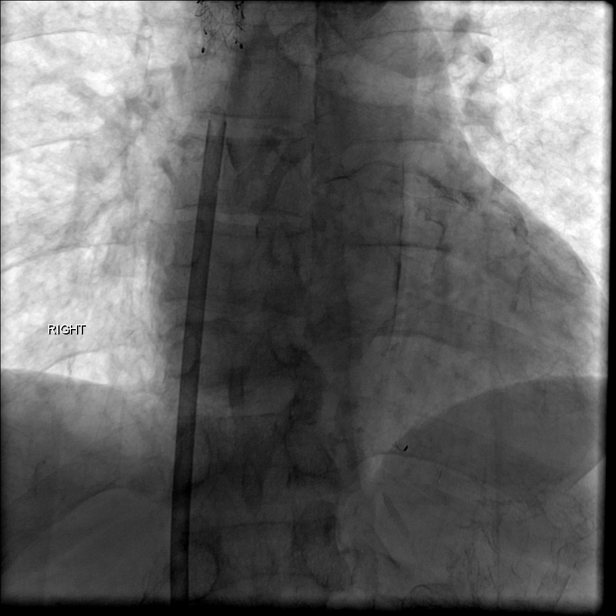

[12 of 12 positions shown; findings below may reference images not displayed]

IMAGES IMPORTED FROM THE SYNGO WORKFLOW SYSTEM
NO DICTATION FOR STUDY

## 2014-09-07 NOTE — Consult Note (Signed)
General Aspect infected dialysis graft    Present Illness The patient is a 64 year old female with a known history of hypertension, diabetes, end-stage renal disease on hemodialysis is being admitted for possible systemic inflammatory response syndrome likely due to a dialysis graft infection. She has been running fever for last few days; the highest she could recall was 101.7. In the ED her temperature was 103.3 orally and she was tachycardic up to 118 to 120s. She is being admitted for further evaluation and management. She denies any cough or any other symptoms. She is also having diarrhea.  PAST MEDICAL HISTORY: 1. End-stage renal disease on hemodialysis Tuesday, Thursday, Saturday. 2. Legal blindness. 3. Diabetes. 4. Peripheral vascular disease status post bilateral below-knee amputation 5. History of shunt infections.   PAST SURGICAL HISTORY: 1. Cholecystectomy. 2. Thrombectomy. 3. Hysterectomy. 4. Bilateral below-knee amputation. 5. History of previous graft placement for dialysis access.   Home Medications: Medication Instructions Status  Renvela 800 mg oral tablet 4 tab(s) orally with meals and 2 tabs with a snack Active  atorvastatin 10 mg oral tablet 1 tab(s) orally once a day (at bedtime) Active  atropine-diphenoxylate 0.025 mg-2.5 mg oral tablet 1 tab(s) orally every 8 hours, As Needed- for Diarrhea  Active  pentoxifylline 400 mg oral tablet, extended release 1 tab(s) orally 3 times a day Active  Sensipar 60 mg oral tablet 2 tab(s) orally once a day (in the morning) and 1 tablet every evening Active  Vitamin D2 50,000 intl units (1.25 mg) oral capsule 1 cap(s) orally once a week on Saturday Active    Zocor: Other  Cozaar: Unknown  Cortisone: Unknown  Case History:   Family History Non-Contributory    Social History negative tobacco, negative ETOH, negative Illicit drugs   Review of Systems:   Fever/Chills Yes    Cough No    Sputum No    Abdominal Pain No     Diarrhea Yes    Constipation No    Nausea/Vomiting No    SOB/DOE No    Chest Pain No    Telemetry Reviewed NSR    Dysuria No   Physical Exam:   GEN well developed, ill appearing    HEENT PERRL, hearing intact to voice, poor dentition    NECK supple  trachea midline    RESP normal resp effort  no use of accessory muscles    CARD regular rate  no JVD    VASCULAR ACCESS AV graft present  Good bruit  Good thrill  area in the mid graft that is inflamed no frank ulceration    ABD denies tenderness  soft    EXTR negative cyanosis/clubbing, negative edema    SKIN normal to palpation, No rashes, No ulcers    NEURO follows commands, motor/sensory function intact    PSYCH alert, good insight   Nursing/Ancillary Notes: **Vital Signs.:   28-Aug-13 12:20   Vital Signs Type Routine   Temperature Temperature (F) 101.3   Celsius 38.5   Temperature Source oral   Pulse Pulse 90   Respirations Respirations 19   Systolic BP Systolic BP 99   Diastolic BP (mmHg) Diastolic BP (mmHg) 47   Mean BP 64   Pulse Ox % Pulse Ox % 94   Oxygen Delivery 2L   Pulse Ox Heart Rate 90   Hepatic:  28-Aug-13 02:13    Bilirubin, Total 0.8   Alkaline Phosphatase 85   SGPT (ALT) 15   SGOT (AST) 30   Total  Protein, Serum 7.1   Albumin, Serum  2.9  Routine Chem:  28-Aug-13 02:13    Result Comment troponin - RESULTS VERIFIED BY REPEAT TESTING.  - prev c/ @1825  01/15/12 mpg  Result(s) reported on 16 Jan 2012 at 02:57AM.   Glucose, Serum  173   BUN  55   Creatinine (comp)  7.31   Sodium, Serum  134   Potassium, Serum 4.8   Chloride, Serum  96   CO2, Serum 25   Calcium (Total), Serum 9.9   Osmolality (calc) 287   eGFR (African American)  6   eGFR (Non-African American)  5 (eGFR values <44m/min/1.73 m2 may be an indication of chronic kidney disease (CKD). Calculated eGFR is useful in patients with stable renal function. The eGFR calculation will not be reliable in acutely ill  patients when serum creatinine is changing rapidly. It is not useful in  patients on dialysis. The eGFR calculation may not be applicable to patients at the low and high extremes of body sizes, pregnant women, and vegetarians.)   Anion Gap 13    09:02    Result Comment TROPONIN - RESULTS VERIFIED BY REPEAT TESTING.  - PREVIOUSLY CALLED TO BEBE BROWN AT  - 19702ON 01/15/2012 BY [facility]  Result(s) reported on 16 Jan 2012 at 10:09AM.  Cardiac:  28-Aug-13 02:13    CK, Total  218   CPK-MB, Serum 0.8 (Result(s) reported on 16 Jan 2012 at 02:55AM.)   Troponin I  2.15 (0.00-0.05 0.05 ng/mL or less: NEGATIVE  Repeat testing in 3-6 hrs  if clinically indicated. >0.05 ng/mL: POTENTIAL  MYOCARDIAL INJURY. Repeat  testing in 3-6 hrs if  clinically indicated. NOTE: An increase or decrease  of 30% or more on serial  testing suggests a  clinically important change)    09:02    CK, Total 161   CPK-MB, Serum  < 0.5 (Result(s) reported on 16 Jan 2012 at 09:57AM.)   Troponin I  2.40 (0.00-0.05 0.05 ng/mL or less: NEGATIVE  Repeat testing in 3-6 hrs  if clinically indicated. >0.05 ng/mL: POTENTIAL  MYOCARDIAL INJURY. Repeat  testing in 3-6 hrs if  clinically indicated. NOTE: An increase or decrease  of 30% or more on serial  testing suggests a  clinically important change)  Routine Hem:  28-Aug-13 02:13    WBC (CBC) 5.8   RBC (CBC)  3.56   Hemoglobin (CBC)  10.5   Hematocrit (CBC)  32.8   Platelet Count (CBC)  94   MCV 92   MCH 29.4   MCHC  31.9   RDW  17.8   Neutrophil % 82.9   Lymphocyte % 8.6   Monocyte % 7.9   Eosinophil % 0.0   Basophil % 0.6   Neutrophil # 4.8   Lymphocyte #  0.5   Monocyte # 0.5   Eosinophil # 0.0   Basophil # 0.0 (Result(s) reported on 16 Jan 2012 at 03:54AM.)     Impression 1. Systemic inflammatory response syndrome likely due to dialysis access site infection. Will start on IV vancomycin. Consultation infectious disease. I do not find any other  source. She could not produce urine. Chest x-ray seems okay. Will get two sets of blood cultures also.  I will place a temp cath and will follow the area of her graft but there is a stong possibility that the graft will need to be excised. 2. Elevated cardiac enzymes likely due to supply/demand ischemia although cannot rule out non-ST elevation MI. At  this time will start her on aspirin. Consult cardiology. Obtain 2-D echo. Hold off on full dose anticoagulation at this time. She will be monitored in the Critical Care Unit.  3. End-stage renal disease on hemodialysis. Consult nephrology for further dialysis needs. She gets dialyzed Tuesday, Thursday, Saturday and missed today. 4. Peripheral vascular disease on pentoxifylline. Will continue same.    Plan level 4   Electronic Signatures: Hortencia Pilar (MD)  (Signed 28-Aug-13 18:36)  Authored: General Aspect/Present Illness, Home Medications, Allergies, History and Physical Exam, Vital Signs, Labs, Impression/Plan   Last Updated: 28-Aug-13 18:36 by Hortencia Pilar (MD)

## 2014-09-07 NOTE — H&P (Signed)
PATIENT NAME:  Anita Fleming, Anita Fleming MR#:  [id number] DATE OF BIRTH:  13-Apr-1951  DATE OF ADMISSION:  01/15/2012  PRIMARY CARE PHYSICIAN: Dr. Laruth Bouchard  REQUESTING PHYSICIAN: Dr. Dorothea Glassman  CHIEF COMPLAINT: Fever.  HISTORY OF PRESENT ILLNESS: Patient is a 64 year old female with a known history of hypertension, diabetes, end-stage renal disease on hemodialysis is being admitted for possible systemic inflammatory response syndrome likely due to fistula infection. Patient did not have hemodialysis today as her transportation forgot to pick her up. As per patient she has been running fever for last few days; the highest she could recall was 101.7. She came into the Emergency Department as she was still running fever. While in the ED her temperature was 103.3 orally and she was tachycardic up to 118 to 120s. She is being admitted for further evaluation and management. She denies any cough or any other symptoms.   PAST MEDICAL HISTORY: 1. End-stage renal disease on hemodialysis Tuesday, Thursday, Saturday. 2. Legal blindness. 3. Diabetes. 4. History of thrombosis.  5. History of shunt infections.  6. History of peripheral vascular disease status post bilateral below-knee amputation.   PAST SURGICAL HISTORY: 1. Cholecystectomy. 2. Thrombectomy. 3. Hysterectomy. 4. Bilateral below-knee amputation. 5. History of previous graft placement for dialysis access.   ALLERGIES: Cortisone, Cozaar and Zocor.   SOCIAL HISTORY: She lives at her place and her son lives with her. She is wheelchair bound. Former smoker. No alcohol or drug use.   FAMILY HISTORY: Six sisters and two brothers with diabetes and family history of high blood pressure.   MEDICATIONS AT HOME: 1. Atorvastatin 10 mg p.o. at bedtime. 2. Atropine/diphenoxylate 1 tablet p.o. q.8 hours as needed. 3. Pentoxifylline 400 mg p.o. t.i.d.  4. Renvela 800 mg 4 tablets p.o. with meals and 2 tablets with snacks.  5. Sensipar 60 mg 2 tablets p.o.  daily in the morning and 1 tablet every evening. 6. Vitamin D 1 capsule every week on Saturday.   REVIEW OF SYSTEMS: CONSTITUTIONAL: Positive for fever and also weakness. EYES: Legal blindness. ENT: No tinnitus or ear pain. RESPIRATORY: No cough, wheezing. CARDIOVASCULAR: No chest pain, orthopnea, edema. GASTROINTESTINAL: No nausea, vomiting, diarrhea. GENITOURINARY: No dysuria, hematuria. She does not produce any urine. She is on dialysis. ENDOCRINE: No polyuria or nocturia. HEMATOLOGY: Anemia or chronic disease. No easy bruising or bleeding. SKIN: She has minimal redness around her fistula on her left hand. MUSCULOSKELETAL: No arthritis or muscle cramps. NEUROLOGICAL: No tingling, numbness, weakness. PSYCH: No history of anxiety, depression.   PHYSICAL EXAMINATION: VITAL SIGNS: Temperature 103.3, heart rate 118 per minute, respirations 20 per minute regular, blood pressure 162/74 mmHg. She is saturating 90% on room air.   GENERAL: Patient is a 64 year old female lying in the bed comfortably without any acute distress.  EYES: Pupils equal, round, reactive to light, accommodation. No scleral icterus. Extraocular movements are intact.   HENT: Head atraumatic, normocephalic. Oropharynx and nasopharynx clear.   NECK: Supple. No jugular venous distention. No thyroid enlargement or tenderness.  LUNGS: Clear to auscultation bilaterally. No wheezing, rales, rhonchi, crepitations.  CARDIOVASCULAR: S1, S2 normal. No murmurs, rales or gallops.  ABDOMEN: Soft, nontender, nondistended. Bowel sounds present. No organomegaly or masses.   EXTREMITIES: She has bilateral below-knee amputation. She has a good pulse in her left upper extremity fistula but there is redness around and on top of the fistula site. She also has multiple healed surgical scars in her extremities.   NEUROLOGICAL: Did not check strength in the  lower extremities but moving all extremities well. Cranial nerves II through XII intact.  Muscle strength seems to be intact. Sensation intact.   PSYCH: Patient is oriented to time, place and person x3.   SKIN: As dictated above in the extremity exam with multiple healed surgical scars on extremities and minimal redness on her left upper arm fistula site.   LABORATORY, DIAGNOSTIC AND RADIOLOGICAL DATA: Normal BMP except BUN 52, creatinine 7.10, blood glucose 180, sodium 135, calcium 10.4. Normal liver function tests except AST 40. Troponin 1.60. TSH 0.324. Normal CBC except platelet count 102 and hemoglobin 11.6.   CT scan of the head without contrast while in the ED showed no acute intracranial abnormalities.   EKG shows sinus tach with a rate of 120 per minute and left anterior fascicular block. No major ST-T changes.   Chest x-ray showed no acute cardiopulmonary disease per ED physician.   IMPRESSION AND PLAN:  1. Possible systemic inflammatory response syndrome likely due to dialysis access site infection. Will start on IV vancomycin. Consultation infectious disease. I do not find any other source. She could not produce urine. Chest x-ray seems okay. Will get two sets of blood cultures also. 2. Elevated cardiac enzymes likely due to supply/demand ischemia although cannot rule out non-ST elevation MI. At this time will start her on aspirin. Consult cardiology. Obtain 2-D echo. Hold off on full dose anticoagulation at this time. She will be monitored in the Critical Care Unit.  3. End-stage renal disease on hemodialysis. Consult nephrology for further dialysis needs. She gets dialyzed Tuesday, Thursday, Saturday and missed today. 4. Peripheral vascular disease on pentoxifylline. Will continue same. Consider vascular consult if needed. 5. Abnormal TSH. Will check free T3 and free T4. This could be manifestation of ongoing illness but cannot rule out hyperthyroidism at this time.  6. Diabetes. Seems to be diet controlled only. I do not see her on any medication at this time. Will  monitor her blood sugar while in the hospital.  7. Hyperlipidemia. Will continue atorvastatin.   TIME SPENT: Total time taking care of this patient including coordination of care, reviewing records and discussion with ED physician was 55 minutes.   ____________________________ Ellamae SiaVipul S. Sherryll BurgerShah, MD vss:cms D: 01/15/2012 19:40:00 ET T: 01/16/2012 06:13:59 ET JOB#: [id number]  cc: Shandell Jallow S. Sherryll BurgerShah, MD, <Dictator> Tamika J. Lazarus SalinesLott, MD Patricia PesaVIPUL S Zale Marcotte MD ELECTRONICALLY SIGNED 01/17/2012 16:33

## 2014-09-07 NOTE — Op Note (Signed)
PATIENT NAME:  Anita Fleming, Anita Fleming MR#:  [id number] DATE OF BIRTH:  1950/10/14  DATE OF PROCEDURE:  01/16/2012  PREOPERATIVE DIAGNOSES:  1. Infected left arm AV graft. 2. End-stage renal disease requiring hemodialysis.   POSTOPERATIVE DIAGNOSES:  1. Infected left arm AV graft. 2. End-stage renal disease requiring hemodialysis.   PROCEDURE PERFORMED: Attempted right IJ dialysis catheter placement.   PROCEDURE PERFORMED BY: Katha Cabal, MD   PROCEDURE: The patient is in her hospital bed. She is positioned supine. Right neck is prepped and draped in a sterile fashion. Ultrasound is placed in a sterile sleeve. Ultrasound is utilized secondary to lack of appropriate landmarks to avoid vascular injury. The jugular vein is identified. It is echolucent, homogeneous, and easily compressible. It is of normal size and caliber. It indicates patency. Seldinger needle is then used to access it under direct visualization after image is recorded. J-wire is then advanced without difficulty but only to approximately 15 cm. Multiple attempts at steering the wire including using the straight floppy side, the J-wire as well as a micropuncture kit with a floppy microwire were made all met with the same result at approximately 15 cm. No further forward movement was noted. The micro sheath was passed over the J-wire and exchanged to allow for the floppy wire and there was ready blood return indicating that we are intraluminal. 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 She will, therefore, be brought down to Special Procedures for contrast imaging and subsequent placement of the line. Femorals are not accessed as the patient was having copious stooling.   ____________________________ Katha Cabal, MD ggs:drc D: 01/16/2012 17:15:23 ET T: 01/16/2012 17:57:12 ET JOB#: [id number]  cc: Katha Cabal, MD, <Dictator> Murlean Iba, MD Katha Cabal MD ELECTRONICALLY SIGNED  01/25/2012 16:24

## 2014-09-07 NOTE — Consult Note (Signed)
Impression: 64yo BF w/ h/o ESRD on HD, DM, s/p bilat BKA admitted with GPC bacteremia.  Blood cultures are now growing GPC in clusters.  This is likely to be Staph infection.  Will continue vanco. Will stop other antibiotics.  Her fistula has some bleeding, but there is no obvious evidence for local infection.  We could get an u/s of the fistula to look for an abscess, but she states that it has been functioning well and there is a good thrill.  No fluctuance or erythema. Will get TTE. Will repeat BCx in 2 days to document clearance. [facility].  Electronic Signatures: Lailyn Appelbaum, Rosalyn GessMichael E (MD)  (Signed on 28-Aug-13 14:49)  Authored  Last Updated: 28-Aug-13 14:49 by Adaria Hole, Rosalyn GessMichael E (MD)

## 2014-09-07 NOTE — Consult Note (Signed)
PATIENT NAME:  Anita Fleming, Anita Fleming MR#:  [id number] DATE OF BIRTH:  1951/05/16  DATE OF CONSULTATION:  04/23/2012  REFERRING PHYSICIAN:  Dr. Mordecai Maes  CONSULTING PHYSICIAN:  Rosalyn Gess. Mayford Alberg, MD  REASON FOR CONSULTATION: Fatigue with recent Methicillin-sensitive Staphylococcus aureus bacteremia.   HISTORY OF PRESENT ILLNESS: The patient is a 64 year old female with a past history significant for end-stage renal disease on hemodialysis, diabetes, status post bilateral BKA, and recent Methicillin-sensitive Staphylococcus aureus bacteremia in September of 2013 who was admitted yesterday with increasing fatigue. The patient states that she has been noting increased fatigue and general malaise for the last several days. The history and physical indicates that her family stated she had several days of increased fatigue and being less active. She states her dialysis has been going well with no problems. No episodes of hypotension during dialysis. She has not had to stop early. She has had no problems with the PermCath in the left groin. During her past hospitalization she had her fistula removed and there was frank pus intraoperatively. She received two weeks of IV antibiotics for this and apparently had done well. She denies any fevers, chills, or sweats at home. However, she has had some low-grade fevers here in the hospital today. She is currently on vancomycin and Zosyn. Blood cultures are pending.   ALLERGIES: Cortisone, Cozaar, and Zocor.   PAST MEDICAL HISTORY:  1. End-stage renal disease, on hemodialysis.  2. Diabetes.  3. Coronary artery disease with recent non-ST elevation MI in August 2013.  4. MSSA fistula infection, status post removal of the fistula in 01/2012.  5. Peripheral vascular disease.  6. Status post bilateral BKA.  7. Hypercholesterolemia.  8. Diabetes.  9. Possible DVT.  10. Patient is blind.  11. Status post cholecystectomy.  12. Status post hysterectomy.   SOCIAL HISTORY: She  lives with her son. She gets around in a wheelchair. She is a prior smoker. She does not drink. She does not smoke   FAMILY HISTORY: Positive for diabetes and high blood pressure.   REVIEW OF SYSTEMS: GENERAL: No fevers, chills, sweats. Some malaise. She did have fever in the hospital today, however. HEENT: No headaches. Some sinus congestion. No sore throat. NECK: No stiffness. No swollen glands. RESPIRATORY: No cough. No shortness of breath. No sputum production. CARDIAC: No chest pains or palpitations. GI: Mild nausea. No vomiting. No abdominal pain. No change in her bowels. GU: She is on dialysis. Does not make much urine. MUSCULOSKELETAL: She's had some pains in the anterior groin bilaterally. No particular discomfort at the site of the PermCath insertion, however. No problems with her BKA stumps. SKIN: No rashes other than some ulcers around her stumps. These have been stable. NEUROLOGIC: No focal weakness. She is legally blind. PSYCHIATRIC: No complaints. All other systems are negative.   PHYSICAL EXAMINATION:   VITAL SIGNS: T-max 101.1, T-current 101.1, pulse 97, blood pressure 96/60, 94% on room air.   GENERAL: Cachectic 64 year old black female in no acute distress.   HEENT: Normocephalic, atraumatic. Pupils equal and reactive to light. Extraocular motion appeared to be intact. She had poor vision bilaterally. Sclerae, conjunctivae, and lids were somewhat injected but no petechiae or hemorrhages were noted. Oropharynx shows no erythema or exudate. The patient is edentulous.   NECK: Midline trachea. No lymphadenopathy. No thyromegaly.   CHEST: Clear to auscultation bilaterally with good air movement. No focal consolidation.   CARDIAC: Regular rate and rhythm without murmur, rub, or gallop.   ABDOMEN: Soft, nontender, nondistended.  No hepatosplenomegaly. No hernia is noted.   EXTREMITIES: No evidence for tenosynovitis. She is status post bilateral BKA. There is some ulcerations over the  stumps of the BKAs but these did not appear to have any evidence for inflammation or infection.   SKIN: No rashes other than the above-mentioned ulcers. There were no stigmata of endocarditis, specifically no Janeway lesions or Osler nodes. She had a PermCath in the left inguinal region that was nontender.   NEUROLOGIC: The patient was awake and interactive, moving all four extremities.   PSYCHIATRIC: Mood and affect appeared normal.   LABORATORY DATA: BUN 26, creatinine 4.19, bicarbonate 27, anion gap 9. LFTs from admission showed an AST of 507, ALT of 171, alkaline phosphatase 231, total bilirubin 0.8. White count 8.7, hemoglobin 9.8, platelet count 91, ANC 7.5. White count on admission was 8.1. Blood cultures from admission show no growth. Review of her prior LFTs going back to August 28th showed her AST and ALT were normal.   A chest x-ray from admission showed mildly enlarged cardiac chambers and prominent pulmonary vascularity but no pulmonary edema or effusion. There is no focal pneumonia present.   Abdominal ultrasound showed common bile duct was 7.2. The gallbladder was not present. Pancreas could not be directly visualized.   A CT scan of the abdomen and pelvis with contrast demonstrated dilated pancreatic duct without a definite obstructing mass.   IMPRESSION: This is a 64 year old female with a past history significant for end-stage renal disease on hemodialysis, diabetes status post BKA, and recent Methicillin-sensitive Staphylococcus aureus bacteremia who is admitted with increased fatigue and transaminitis.   RECOMMENDATIONS:  1. She has had no problems with hemodialysis or her most recent PermCath. She has had no fevers or chills at home. She has had some low-grade fever today over 101.  2. Blood cultures are pending.  3. Would continue vancomycin and Zosyn until her blood cultures return.  4. Her prior fistula sites look good.  5. Her LFTs are elevated but her bilirubin is  normal. Hepatitis A IgM is negative. Hepatitis surface antigen is negative. Hepatitis B core IgM antibody is negative. Will check a Hepatitis C antibody as well.  6. Consider GI consultation for transaminitis.   This is a moderately complex Infectious Disease case. Thank you very much for involving me in Ms. Couillard's care.   ____________________________ Rosalyn GessMichael E. Rafan Sanders, MD meb:drc D: 04/23/2012 14:48:57 ET T: 04/23/2012 15:12:56 ET JOB#: [id number]  cc: Rosalyn GessMichael E. Kilan Banfill, MD, <Dictator> Harvey Lingo E Blondie Riggsbee MD ELECTRONICALLY SIGNED 05/02/2012 10:18

## 2014-09-07 NOTE — Consult Note (Signed)
Impression: 64yo Female w/ h/o ESRD on HD, DM, s/p bilat BKA, recent MSSA bacteremia admitted with increased fatigue and transaminitis.  She has had no problems with HD or with her most recent permacath. No fevers or chills at home.  She has had some low grade fever today over 101. Blood cultures are pending.   Will continue vanco and zosyn until BCx return. Her prior fistula sites look good. 5) Her LFTs are elevated, but her bilirubin is normal.  Will get Hep C ab.  Would consider GI consultation.    Electronic Signatures: Axiel Fjeld, Rosalyn GessMichael E (MD) (Signed on 04-Dec-13 14:38)  Authored   Last Updated: 04-Dec-13 14:48 by Starlett Pehrson, Rosalyn GessMichael E (MD)

## 2014-09-07 NOTE — H&P (Signed)
PATIENT NAME:  Anita Fleming, Anita Fleming MR#:  [id number] DATE OF BIRTH:  [birth year]  DATE OF ADMISSION:  05/04/2012  PRIMARY CARE PHYSICIAN: Laruth Bouchardamika Lott, MD  REFERRING PHYSICIAN: Si Raiderhristine Braud, MD  CHIEF COMPLAINT: Bacteremia.   HISTORY OF PRESENT ILLNESS: [name] is a 64 year old pleasant African American female with history of end-stage renal disease on hemodialysis on Tuesday, Thursday and Saturday. She was admitted last time here on 12/03 and discharged on 04/27/2012 and she had fever and hypotension at that time. The patient was called to be readmitted to the hospital after 2 blood cultures grew gram-negative rods. The patient is here to be admitted for further evaluation. In the interim, the patient tells me that she feels fine. She denies any fever now, but she had fever a week ago. She denies any chills, no rigors, no chest pain, no cough, no vomiting, no abdominal pain and no urinary symptoms. She denies any abscess anywhere or any tenderness.   REVIEW OF SYSTEMS: CONSTITUTIONAL: Denies any fever. No chills. No fatigue. EYES: No blurring of vision and no double vision, other than her baseline of legally blind. ENT: No hearing impairment. No sore throat. No dysphagia. CARDIOVASCULAR: No chest pain. No shortness of breath. No syncope. RESPIRATORY: No cough. No sputum production. No chest pain. No shortness of breath. GASTROINTESTINAL: No abdominal pain. No vomiting. No diarrhea. GENITOURINARY: No dysuria. No frequency of urination. The patient is on dialysis. MUSCULOSKELETAL: No joint pain or swelling. No muscular pain or swelling. INTEGUMENTARY: No skin rash. No ulcers. No abscess formation. NEUROLOGY: No focal weakness. No seizure activity. No headache. The patient is legally blind. PSYCHIATRY: No anxiety. No depression. ENDOCRINE: No polyuria or polydipsia. No heat or cold intolerance. HEMATOLOGY: No easy bruisability. No lymph node enlargement.   PAST MEDICAL HISTORY: 1. End-stage renal disease on  hemodialysis on Tuesday, Thursday and Saturday.  2. Coronary artery disease status post myocardial infarction or non-ST-elevation MI in August 2013.  3. History of infections of AV graft.  4. Peripheral vascular disease.  5. Status post bilateral below knee amputation.  6. Diabetes mellitus type 2, on insulin.  7. Hyperlipidemia.  8. The patient is legally blind.   PAST SURGICAL HISTORY: 1. Hysterectomy.  2. Cholecystectomy. 3. Thrombectomy. 4. Bilateral below-knee amputations. 5. History of graft placements for dialysis and PermCath insertions.   SOCIAL HABITS: Nonsmoker. No history of alcohol or drug abuse.   SOCIAL HISTORY: She is widowed, lives at home and her son lives with her. The patient is legally blind, but she uses a motorized wheelchair to go around.  FAMILY HISTORY: Her brothers have diabetes. A sister has hypertension.   ADMISSION MEDICATIONS:  1. Lipitor 10 mg a day. 2. Aspirin 325 mg a day. 3. Atropine with diphenoxylate p.r.n. for diarrhea.  4. Sliding scale for insulin.  5. Metoprolol 12.5 mg once a day.  6. Pentoxifylline 400 mg once a day.  7. Sensipar 120 mg in the morning and 60 mg in the evening.  8. Sevelamer 2.4 grams three times a day.  9. Vitamin D2 50,000 units once a week, on Saturday.   ALLERGIES: ZOCOR AND COZAAR.   PHYSICAL EXAMINATION:  VITAL SIGNS: Blood pressure 131/72, respiratory rate 16, pulse 96, temperature 97.5 and oxygen saturation 98%.   GENERAL APPEARANCE: Elderly female lying in bed in no acute distress.   HEAD AND NECK: No pallor. No icterus. No cyanosis.   ENT: Ear examination revealed normal hearing. No discharge. No lesions. Nasal mucosa was normal without  ulcers. No discharge. Oropharyngeal area was normal without ulcers. No oral thrush.   EYES: Normal eyelids and conjunctiva. Pupils were about 4 mm, round and equal and very sluggish reaction to light.   NECK: Supple. Trachea at midline. No thyromegaly. No cervical  lymphadenopathy. No masses.   HEART: Normal S1, S2. No S3, S4. No murmur. No gallop. No carotid bruits.   RESPIRATORY: Normal breathing pattern without use of accessory muscles. No rales. No wheezing.   ABDOMEN: Soft without tenderness. No hepatosplenomegaly. No masses. No hernias.   SKIN: No ulcers. No subcutaneous nodules. She has a PermCath on the left thigh. The site looks clean. I do not see any swelling, no redness and no discharge.   MUSCULOSKELETAL: No joint swelling. No clubbing. She has bilateral below knee amputation.   NEUROLOGIC: Apart from being blind, her other cranial nerves II through XII are intact. No focal motor deficit.   PSYCHIATRIC: The patient is alert and oriented x3. Mood and affect were normal.   LABORATORY FINDINGS: Her serum glucose is 132, BUN 35, creatinine 6.3, sodium 139 and potassium 5.3. Her bicarbonate was 22, albumin 2.7, alkaline phosphatase 143 and AST and ALT were normal. CBC showed white count of 8000, hemoglobin 9.7, hematocrit 31 and platelet count 128.   Two blood cultures taken on 05/03/2012 are growing gram-negative rods.      ASSESSMENT: 1. Gram-negative bacteremia. The source is unclear, but likely it is the PermCath catheter in the absence of other sources.  2. End-stage renal disease, on hemodialysis. 3. Peripheral vascular disease status post bilateral below-knee amputations.  4. Her other medical problems include diabetes mellitus type 2 on insulin, hyperlipidemia, coronary artery disease and chronic diarrhea.   PLAN: We will admit the patient to the medical floor and blood cultures x 2 additionally were taken. She received Rocephin 1 gram daily. This may not cover all gram negatives; therefore, she received now one dose of gentamicin pending results of the blood cultures. I will continue her home medications. Accu-Chek and sliding scale. Consult infectious disease, Dr. Leavy Cella, for further input. Consult nephrology, Dr. Cherylann Ratel.    TIME SPENT: In evaluating this patient took more than 55 minutes including reviewing her medical records. ____________________________ Carney Corners. Rudene Re, MD amd:sb D: 05/04/2012 23:24:31 ET T: 05/05/2012 08:23:21 ET JOB#: [id number]  cc: Carney Corners. Rudene Re, MD, <Dictator> Zollie Scale MD ELECTRONICALLY SIGNED 05/05/2012 22:32

## 2014-09-07 NOTE — Consult Note (Signed)
Brief Consult Note: Diagnosis: bacteremia; SVC syndrome; ESRD.   Comments: await culture results will plan to remove catheter is bacteremia persists.  Electronic Signatures: Levora DredgeSchnier, Gregory (MD)  (Signed 17-Dec-13 17:47)  Authored: Brief Consult Note   Last Updated: 17-Dec-13 17:47 by Levora DredgeSchnier, Gregory (MD)

## 2014-09-07 NOTE — Discharge Summary (Signed)
Anita Fleming NAME:  Anita Fleming, Anita Fleming MR#:  [id number] DATE OF BIRTH:  Jul 02, 1950  DATE OF ADMISSION:  01/15/2012 DATE OF DISCHARGE:  01/28/2012  Discharge 09/09 pending wound VAC set up at the nursing home.   PRIMARY CARE PHYSICIAN: Dr. Laruth Bouchardamika Lott   Anita Fleming's interim discharge summary done by Dr. Auburn BilberryShreyang Patel on 09/04 and it still remains the same.   DISCHARGE DIAGNOSES: Systemic inflammatory response syndrome secondary to arteriovenous graft infection with methicillin-susceptible Staphylococcus aureus bacteremia status post removal arteriovenous graft. Right now Anita Fleming has wound VAC at site and Anita Fleming needs intravenous Ancef with each hemodialysis until 09/16. Anita Fleming needs 2 grams on Tuesday and Thursday and 3 grams on Saturday after each dialysis until 09/16.   OTHER DIAGNOSES:  1. Possible non-ST-elevation myocardial infarction.  2. End-stage renal disease, on hemodialysis Tuesday, Thursday, Saturday. Anita Fleming has Perm-A-Cath now.  3. Peripheral vascular disease with bilateral below knee amputation. 4. Hyperlipidemia. 5. Diabetes.  6. History of previous shunt infections. 7. Legal blindness.   DISCHARGE MEDICATIONS:  1. Metoprolol 12.5 mg p.o. daily.  2. Vitamin D2 50,000 units weekly.  3. Benadryl as needed for itching.  4. Trental 400 mg p.o. daily.  5. Cefazolin as I mentioned.   6. Ambien 5 mg daily as needed for sleep.  7. Aspirin 325 mg p.o. daily.  8. Atorvastatin 10 mg p.o. daily.  9. Lomotil as needed for diarrhea.   CONSULTATIONS:  1. Nephrology consult with Dr. Mady HaagensenMunsoor Lateef and Dr. Thedore MinsSingh.  2. Cardiology consult with Dr. Kristeen MissPhilip Nahser.  3. ID consult with Dr. Leavy CellaBlocker. 4. Vascular consult with Dr. Gilda CreaseSchnier.   HOSPITAL COURSE: Please refer to the interim discharge summary done by Dr. Auburn BilberryShreyang Patel for full details. Anita Fleming is a 64 year old female with end-stage renal disease, diabetes, history of shunt infections came in with:  1. Fever, noted to have MSSA bacteremia.  Anita Fleming's AV shunt was removed. Wound VAC was placed. Anita Fleming is on cefazolin and Anita Fleming had 2-D echocardiogram done. Echocardiogram showed ejection fraction of 35% with the concentric left ventricular hypertrophy with global hypokinesia. Anita Fleming unable to get beta blockers because of hypotension. EKG showed sinus tach. Anita Fleming's blood cultures repeat cultures on 08/30 no growth. Anita Fleming's blood cultures from 08/27 showed MSSA. After that she had AV graft which was removed. Dr. Leavy CellaBlocker recommended continue antibiotics until 09/16. Anita Fleming's echo did not show any vegetations.  2. Hypotension. Right now hypotension is resolved. We are going to start her on a small dose of metoprolol and also continue the aspirin.  3. Regarding her non-ST-elevation myocardial infarction, Anita Fleming seen by cardiologist and Anita Fleming did not have any chest pain. They did not recommend any further work-up. Anita Fleming may need work-up as an outpatient.  4. End-stage renal disease, on hemodialysis. She had a Perm-A-Cath placed and that is working now. Perm-A-Cath was placed on 09/05 in the left thigh and that is working well.  5. Anita Fleming's vitals are stable today. Blood pressure is 130/68 so we are going to start her on beta blocker low dose and she will be on sliding scale with coverage for her diabetes.   TIME SPENT ON DISCHARGE PREPARATION: More than 30 minutes. Please refer to the interim discharge summary as well.   ____________________________ Katha HammingSnehalatha Glayds [place], MD sk:cms D: 01/28/2012 14:24:00 ET T: 01/28/2012 16:28:26 ET JOB#: [id number] cc: Katha HammingSnehalatha Tarita Deshmukh, MD, <Dictator> Tamika J. Lazarus SalinesLott, MD Katha HammingSNEHALATHA Clyde Upshaw MD ELECTRONICALLY SIGNED 02/05/2012 16:15

## 2014-09-07 NOTE — Consult Note (Signed)
PATIENT NAME:  Anita Fleming, Anita Fleming MR#:  [id number] DATE OF BIRTH:  1951-02-20  DATE OF CONSULTATION:  01/16/2012  REFERRING PHYSICIAN:  Alford Highland, MD  CONSULTING PHYSICIAN:  Rosalyn Gess. Samyra Limb, MD  REASON FOR CONSULTATION: Possible fistula infection.   HISTORY OF PRESENT ILLNESS: The patient is a 64 year old white female with a past history significant for end-stage renal disease on hemodialysis, diabetes, and status post bilateral BKAs who was admitted yesterday with fevers and malaise. She is also having some generalized weakness. She denies any focal other symptoms. She has not had any nausea, vomiting, or change in her bowels. She has not had any cough or shortness of breath. She has had no rashes that she recalls. She has had no problems with her BKA stumps. She had been having no problems at dialysis and has had no problems with her fistula which is in her left arm. She did miss dialysis on the day of admission because her ride did not come get her. Her maximum temperature was 103.3 in the Emergency Room. She was admitted initially to the CCU with some confusion. Her blood pressure has been stable and she has not required pressors. She was initially started on vancomycin and Zosyn was added. Blood cultures have now come back as being positive for gram-positive cocci.   ALLERGIES: Cortisone, Cozaar, and Zocor.   PAST MEDICAL HISTORY:  1. Diabetes.  2. End-stage renal disease, on hemodialysis.  3. Peripheral vascular disease, status post bilateral below below-knee amputations.  4. Shunt infections in the past.  5. Status post cholecystectomy.  6. Status post thrombectomy.  7. Status post hysterectomy.   SOCIAL HISTORY: The patient lives with her son. She is wheelchair bound due to her bilateral lower extremity amputations. She is a prior smoker. She does not drink. No history of drug use.   FAMILY HISTORY: Positive for diabetes and hypertension.   REVIEW OF SYSTEMS: GENERAL: Positive for  fevers, malaise, and weakness. No chills or sweats. HEENT: No headaches, no sinus congestion, no sore throat. NECK: No stiffness, no swollen glands. RESPIRATORY: No cough, no shortness of breath, no sputum production. CARDIAC: No chest pains or palpitations. No peripheral edema. GI: No nausea, no vomiting, no abdominal pain, no change in her bowels. GU: She does not make any urine. MUSCULOSKELETAL: She has generalized malaise but no frank muscle or joint pains. SKIN: No rashes. NEUROLOGIC: No focal weakness. PSYCHIATRIC: No complaints. All other systems are negative.   PHYSICAL EXAMINATION:   VITAL SIGNS: T-max 102.5, T-current 99.2, pulse 87, blood pressure 100/61, 93% on 2 liters.   GENERAL: 64 year old black female in no acute distress.   HEENT: Normocephalic, atraumatic. Pupils equal and reactive to light. Extraocular motion intact. Sclerae are somewhat muddy but there is no evidence for emboli or petechiae. Oropharynx shows no erythema or exudate. Gums are in fair condition.   NECK: Supple. Full range of motion. Midline trachea. No lymphadenopathy. No thyromegaly.   CHEST: Clear to auscultation bilaterally with good air movement. No focal consolidation.   CARDIAC: Regular rate and rhythm without murmur, rub, or gallop.   ABDOMEN: Soft, nontender, nondistended. No hepatosplenomegaly. No hernia is noted.   EXTREMITIES: No evidence for tenosynovitis.   SKIN: She has a fistula in place in the left upper extremity. There was some bleeding coming from the fistula site. There was a positive palpable thrill. There was no tenderness over the site nor was there any erythema or atypical edema. She had no other rashes noted. There  were no stigmata of endocarditis, specifically no Janeway lesions or Osler nodes. She had bilateral BKA stumps and the wounds appeared to be well healed without any erythema or drainage.   NEUROLOGIC: The patient was awake and interactive, moving all four extremities.    PSYCHIATRIC: Mood and affect appeared normal.   LABORATORY DATA: BUN 55, creatinine 7.31, bicarbonate 25, anion gap 13. LFTs were unremarkable. She's had troponins that have been slowly trending up. A TSH was 0.324 with a free T4 of 1.25. White count is 5.8 with hemoglobin 10.5, platelet count 94, ANC of 4.8. White count on admission was 6.1. Blood cultures from admission are growing gram-positive cocci in clusters in three out of four bottles. Free T3 was 1.3.   A chest x-ray from admission showed cardiomegaly and prominent lung markings. No obvious infiltrates.   A CT scan of the head without contrast showed no acute abnormalities.   A repeat chest x-ray today showed worsening pulmonary edema and basilar atelectasis on the right.   IMPRESSION: This is a 64 year old black female with a past history significant for end-stage renal disease on hemodialysis, diabetes, and status post bilateral BKAs who is admitted with gram-positive cocci bacteremia.   RECOMMENDATIONS:  1. Blood cultures are now growing gram-positive cocci in clusters. This would likely be a Staph infection.  2. Will continue the vancomycin.  3. Will stop the other antibiotics.  4. Her fistula has some bleeding but there is no obvious evidence for local infection. We could get an ultrasound of the fistula to look for an abscess but she states that it's been functioning well and there was a good thrill. There is no fluctuance or erythema.  5. Will get a transthoracic echocardiogram.  6. Will repeat her blood cultures in two days to document clearance.  7. Will place her on contact isolation until the gram-positive cocci are identified.   This is a high level Infectious Disease consult. Thank you very much for involving me in Ms. Chesnut's care.   ____________________________ Rosalyn GessMichael E. Chrisa Hassan, MD meb:drc D: 01/16/2012 14:57:51 ET T: 01/16/2012 15:40:30 ET JOB#: [id number]  cc: Rosalyn GessMichael E. Ettore Trebilcock, MD, <Dictator> Braedyn Riggle E  Becky Colan MD ELECTRONICALLY SIGNED 01/18/2012 10:00

## 2014-09-07 NOTE — H&P (Signed)
PATIENT NAME:  Anita Fleming, Anita Fleming MR#:  [id number] DATE OF BIRTH:  1950/06/30  DATE OF ADMISSION:  04/22/2012   ADDENDUM:  This is a critical care admission in that the patient has potential of decompensation due to shock, low blood pressure, and possible septic process.   ____________________________ Felipa Furnaceoberto Sanchez Gutierrez, MD rsg:drc D: 04/22/2012 21:33:08 ET T: 04/23/2012 07:06:12 ET JOB#: [id number]  cc: Felipa Furnaceoberto Sanchez Gutierrez, MD, <Dictator> Armour Villanueva Juanda ChanceSANCHEZ GUTIERRE MD ELECTRONICALLY SIGNED 04/23/2012 15:42

## 2014-09-07 NOTE — Consult Note (Signed)
Impression: 64yo Female w/ h/o ESRD on HD, DM, s/p bilat BKA, recent MSSA bacteremia admitted with Klebsiella bacteremia.  She has had no problems with HD or with her most recent permacath. No fevers or chills at home.  She was seen in the ER for more irritability than typical infectious symptoms.  Her BCx are positive for Klebsiella, however. Rrepeat blood cultures are still growing a GNR, but she was only on vanco at the time.were obtained only 1 day after admission.   Will continue ceftriaxone for now.  Will increase dose to 2g daily. Will likely be able to d/c on oral cipro as it is 100% bioavailable. Her prior fistula sites look good. Will repeat the BCx tomorrow.  If it remains positive, will need to consider removing the permcath.  If the blood cultures clear, then we can try to keep the catheter.    Electronic Signatures: Branton Einstein, Rosalyn GessMichael E (MD) (Signed on 16-Dec-13 15:27)  Authored   Last Updated: 16-Dec-13 15:36 by Harrietta Incorvaia, Rosalyn GessMichael E (MD)

## 2014-09-07 NOTE — Consult Note (Signed)
Brief Consult Note: Diagnosis: SIRS.   Patient was seen by consultant.   Consult note dictated.   Comments: Appreciate consult for pleasant 64 y/o [place] woman with history of severe PVD, bilat BKA, ESRD: dialysis t, th, sat, recent graft infection, and suspected repeated line infection, for evaluation of elevated  LFTs and dilated pancreatic duct. Patient states this is the first time her liver enzymes have been high, never had pancreatitis. Son with unknown liver disease.  Has had blood transfusions, but otherwise denies liver illnesses, etoh, illicits, incarceration, risky sex behaviors, foreign travel, [employer] and [employer] and healthcare service, tattoos, history of jaundice, and history of ascites. Noted negative Hepatitis A and B testing.   Does report a long history of diarrhea, but states this has improved some. Denies abdominal pain, black/tarry/bloody stools, problems with acid reflux/heartburn. Does report some recent weight loss and occasional has trouble swallowing, but unable to state how much. States the trouble swallowing is infrequnet and not increasing.  Impression and plan: Elevated LFTs and dilated pancreatic duct. With current infection, ERCP may put patient at increased risk, so will assess Ca 19-9, CEA, Hepatitis C testing at present. Further recommendations to follow..  Electronic Signatures: Keturah BarreLondon, Christiane H (NP)  (Signed 04-Dec-13 17:47)  Authored: Brief Consult Note   Last Updated: 04-Dec-13 17:47 by Keturah BarreLondon, Christiane H (NP)

## 2014-09-07 NOTE — Consult Note (Signed)
General Aspect Anita Fleming is a very unfortunate 64 yo female with long standing diabetes mellitus complicated by ESRD ( on dialysis), PVD ( s/p bilateral BKA).  she was admitted with fevers and SIRS.  Cardiac enzymes were drawn and she was found to have an elevated Troponin level with normal CK levels.  she denies any episodes of chest pain or dyspnea.  she missed her dialysis yesterday.  Sh has been having pain in her dialysis graft for the past several days    Present Illness Pt has had fever and pain in her dialysis graft for the past several days.  no hx of cardiac problems.   Physical Exam:   GEN cachectic, critically ill appearing    HEENT moist oral mucosa    NECK No masses  elevated JVD    RESP clear BS    CARD Tachycardic  Normal, S1, S2  S3  Murmur    Murmur Systolic    ABD denies tenderness    NEURO follows commands    PSYCH alert   Review of Systems:   General: Fever/chills    Eyes: Vision difficulty    Neck: No Complaints    Respiratory: No Complaints    Gastrointestinal: No Complaints    Musculoskeletal: she has had pain in her left upper arm at the dialysis site.    Neurologic: Fainting     Dialysis:    esrd:    HTN:    PVD - Peripheral Vascular Disease:    CAD: coronary stent   Partially Blind:    Diabetes:    Renal Failure:    Dialysis three times per week:    Gall Bladder Removed:    Hysterectomy:    Bilat BKA:   Home Medications: Medication Instructions Status  Renvela 800 mg oral tablet 4 tab(s) orally with meals and 2 tabs with a snack Active  atorvastatin 10 mg oral tablet 1 tab(s) orally once a day (at bedtime) Active  atropine-diphenoxylate 0.025 mg-2.5 mg oral tablet 1 tab(s) orally every 8 hours, As Needed- for Diarrhea  Active  pentoxifylline 400 mg oral tablet, extended release 1 tab(s) orally 3 times a day Active  Sensipar 60 mg oral tablet 2 tab(s) orally once a day (in the morning) and 1 tablet every evening Active   Vitamin D2 50,000 intl units (1.25 mg) oral capsule 1 cap(s) orally once a week on Saturday Active   Lab Results: Thyroid:  27-Aug-13 17:37    Thyroxine, Free 1.25 (Result(s) reported on 15 Jan 2012 at 07:49PM.)   Thyroid Stimulating Hormone  0.324 (0.45-4.50 (International Unit)  ----------------------- Pregnant patients have  different reference  ranges for TSH:  - - - - - - - - - -  Pregnant, first trimetser:  0.36 - 2.50 uIU/mL)  Hepatic:  27-Aug-13 17:37    Bilirubin, Total 0.7   Alkaline Phosphatase 98   SGPT (ALT) 16   SGOT (AST)  40   Total Protein, Serum 8.2   Albumin, Serum  3.3  28-Aug-13 02:13    Bilirubin, Total 0.8   Alkaline Phosphatase 85   SGPT (ALT) 15   SGOT (AST) 30   Total Protein, Serum 7.1   Albumin, Serum  2.9  Routine Micro:  27-Aug-13 17:36    Micro Text Report BLOOD CULTURE   COMMENT                   NO GROWTH IN 8-12 HOURS   ANTIBIOTIC  Micro Text Report BLOOD CULTURE   COMMENT                   NO GROWTH IN 8-12 HOURS   ANTIBIOTIC                        Culture Comment NO GROWTH IN 8-12 HOURS  Result(s) reported on 16 Jan 2012 at 08:08AM.   Culture Comment NO GROWTH IN 8-12 HOURS  Result(s) reported on 16 Jan 2012 at 08:08AM.  General Ref:  27-Aug-13 17:37    Free T3, Serum ========== TEST NAME ==========  ========= RESULTS =========  = REFERENCE RANGE =  FREE T3, SERUM  Triiodothyronine,Free,Serum Triiodothyronine,Free,Serum     [L  1.3 pg/mL            ]           2.0-4.4               [facility]     No: [id number]           [address], [place], [place] [id number]           Lindon Romp, MD         [id number]   Result(s) reported on 16 Jan 2012 at 06:16AM.  Routine Chem:  27-Aug-13 17:37    Result Comment POTASSIUM,AST - Slight hemolysis, interpret results with  - caution.  Result(s) reported on 15 Jan 2012 at 05:55PM.   Result Comment TROPONIN - RESULTS VERIFIED BY REPEAT  TESTING.  - C/BEBE BROWN,RN 01/15/2012 @1825  CBF  - READ-BACK PROCESS PERFORMED.  Result(s) reported on 15 Jan 2012 at 06:25PM.   Glucose, Serum  180   BUN  52   Creatinine (comp)  7.10   Sodium, Serum  135   Potassium, Serum 4.9   Chloride, Serum 98   CO2, Serum 26   Calcium (Total), Serum  10.4   Osmolality (calc) 289   eGFR (African American)  7   eGFR (Non-African American)  6 (eGFR values <11m/min/1.73 m2 may be an indication of chronic kidney disease (CKD). Calculated eGFR is useful in patients with stable renal function. The eGFR calculation will not be reliable in acutely ill patients when serum creatinine is changing rapidly. It is not useful in  patients on dialysis. The eGFR calculation may not be applicable to patients at the low and high extremes of body sizes, pregnant women, and vegetarians.)   Anion Gap 11   Cholesterol, Serum 150   Triglycerides, Serum 91   HDL (INHOUSE)  68   VLDL Cholesterol Calculated 18   LDL Cholesterol Calculated 64 (Result(s) reported on 15 Jan 2012 at 08:23PM.)  28-Aug-13 02:13    Result Comment troponin - RESULTS VERIFIED BY REPEAT TESTING.  - prev c/ @1825  01/15/12 mpg  Result(s) reported on 16 Jan 2012 at 02:57AM.   Glucose, Serum  173   BUN  55   Creatinine (comp)  7.31   Sodium, Serum  134   Potassium, Serum 4.8   Chloride, Serum  96   CO2, Serum 25   Calcium (Total), Serum 9.9   Osmolality (calc) 287   eGFR (African American)  6   eGFR (Non-African American)  5 (eGFR values <653mmin/1.73 m2 may be an indication of chronic kidney disease (CKD). Calculated eGFR is useful in patients with stable renal function. The eGFR calculation will not be reliable in acutely ill patients when serum creatinine is changing rapidly. It  is not useful in  patients on dialysis. The eGFR calculation may not be applicable to patients at the low and high extremes of body sizes, pregnant women, and vegetarians.)   Anion Gap 13  Cardiac:   27-Aug-13 17:37    Troponin I  1.60 (0.00-0.05 0.05 ng/mL or less: NEGATIVE  Repeat testing in 3-6 hrs  if clinically indicated. >0.05 ng/mL: POTENTIAL  MYOCARDIAL INJURY. Repeat  testing in 3-6 hrs if  clinically indicated. NOTE: An increase or decrease  of 30% or more on serial  testing suggests a  clinically important change)  28-Aug-13 02:13    CK, Total  218   CPK-MB, Serum 0.8 (Result(s) reported on 16 Jan 2012 at 02:55AM.)   Troponin I  2.15 (0.00-0.05 0.05 ng/mL or less: NEGATIVE  Repeat testing in 3-6 hrs  if clinically indicated. >0.05 ng/mL: POTENTIAL  MYOCARDIAL INJURY. Repeat  testing in 3-6 hrs if  clinically indicated. NOTE: An increase or decrease  of 30% or more on serial  testing suggests a  clinically important change)  Routine Hem:  27-Aug-13 17:37    WBC (CBC) 6.1   RBC (CBC) 3.92   Hemoglobin (CBC)  11.6   Hematocrit (CBC) 36.2   Platelet Count (CBC)  102   MCV 93   MCH 29.5   MCHC  31.9   RDW  17.3   Neutrophil % 84.9   Lymphocyte % 8.2   Monocyte % 6.4   Eosinophil % 0.0   Basophil % 0.5   Neutrophil # 5.2   Lymphocyte #  0.5   Monocyte # 0.4   Eosinophil # 0.0   Basophil # 0.0 (Result(s) reported on 15 Jan 2012 at 05:55PM.)  28-Aug-13 02:13    WBC (CBC) 5.8   RBC (CBC)  3.56   Hemoglobin (CBC)  10.5   Hematocrit (CBC)  32.8   Platelet Count (CBC)  94   MCV 92   MCH 29.4   MCHC  31.9   RDW  17.8   Neutrophil % 82.9   Lymphocyte % 8.6   Monocyte % 7.9   Eosinophil % 0.0   Basophil % 0.6   Neutrophil # 4.8   Lymphocyte #  0.5   Monocyte # 0.5   Eosinophil # 0.0   Basophil # 0.0 (Result(s) reported on 16 Jan 2012 at 03:54AM.)   EKG:   Interpretation sinus tach.  No ST or T wave changes    Zocor: Other  Cozaar: Unknown  Cortisone: Unknown  Vital Signs/Nurse's Notes: **Vital Signs.:   28-Aug-13 00:00   Pulse Pulse 104   Respirations Respirations 19   Systolic BP Systolic BP 643   Diastolic BP (mmHg) Diastolic BP  (mmHg) 91   Mean BP 99   Pulse Ox % Pulse Ox % 95   Pulse Ox Heart Rate 108    01:00   Pulse Pulse 106   Respirations Respirations 22   Systolic BP Systolic BP 329   Diastolic BP (mmHg) Diastolic BP (mmHg) 64   Mean BP 90   Pulse Ox % Pulse Ox % 92   Pulse Ox Heart Rate 110    02:00   Pulse Pulse 110   Respirations Respirations 21   Systolic BP Systolic BP 518   Diastolic BP (mmHg) Diastolic BP (mmHg) 61   Mean BP 85   Pulse Ox % Pulse Ox % 92   Pulse Ox Heart Rate 110    03:00   Pulse Pulse 114  Respirations Respirations 20   Systolic BP Systolic BP 681   Diastolic BP (mmHg) Diastolic BP (mmHg) 71   Mean BP 100   Pulse Ox % Pulse Ox % 92   Pulse Ox Heart Rate 114    04:00   Pulse Pulse 118   Respirations Respirations 19   Systolic BP Systolic BP 275   Diastolic BP (mmHg) Diastolic BP (mmHg) 69   Mean BP 97   Pulse Ox % Pulse Ox % 92   Pulse Ox Heart Rate 118    05:00   Pulse Pulse 114   Respirations Respirations 21   Systolic BP Systolic BP 170   Diastolic BP (mmHg) Diastolic BP (mmHg) 57   Mean BP 83   Pulse Ox % Pulse Ox % 95   Pulse Ox Heart Rate 114    06:00   Pulse Pulse 108   Respirations Respirations 9   Systolic BP Systolic BP 017   Diastolic BP (mmHg) Diastolic BP (mmHg) 57   Mean BP 75   Pulse Ox % Pulse Ox % 96   Pulse Ox Heart Rate 110    07:00   Vital Signs Type Routine   Pulse Pulse 108   Respirations Respirations 19   Systolic BP Systolic BP 494   Diastolic BP (mmHg) Diastolic BP (mmHg) 66   Mean BP 91   Pulse Ox % Pulse Ox % 92   Oxygen Delivery 2L   Pulse Ox Heart Rate 108    08:00   Vital Signs Type Routine   Temperature Source axillary   Pulse Pulse 106   Respirations Respirations 20   Systolic BP Systolic BP 496   Diastolic BP (mmHg)Diastolic BP (mmHg) 64   Mean BP 88   Pulse Ox % Pulse Ox % 96   Oxygen Delivery 2L   Pulse Ox Heart Rate 108    08:48   Temperature Temperature (F) 102.5   Celsius 39.1   Temperature Source  oral     Impression Anita Fleming has a hx of DM complicated by ESRD and PVD and is at risk for CAD.  shw was admitted with SIRS and was found to have elevated Troponin levels.  She denies any cardiac symtoms.  I suspect the Troponin elevation is due to the increase cardiac demand due to SIRS in the setting ESRD.  I cannot RO pulmonary embolus as cause of this Troponin elevation ( with normal CK/MB levels) She is a poor candidate for cardiac cath.  I agree with medical therpay - metoprolol and heparin for now.    She does have an S3 on exam - likely due to volume overload from missing dialysis yesterday.  I would favor getting an echo but would like to get her volume back to normal prior to the echo.    Plan continue current meds. echo tomorrow or Friday ( after dialysis and "normilization" of her volume status)   Electronic Signatures: Dametra Whetsel, Dreama Saa (MD)  (Signed 28-Aug-13 09:40)  Authored: General Aspect/Present Illness, History and Physical Exam, Review of System, Past Medical History, Home Medications, Labs, EKG , Allergies, Vital Signs/Nurse's Notes, Impression/Plan   Last Updated: 28-Aug-13 09:40 by Acie Fredrickson Dreama Saa (MD)

## 2014-09-07 NOTE — Consult Note (Signed)
PATIENT NAME:  Anita Fleming, Anita Fleming MR#:  [id number] DATE OF BIRTH:  [birth year]  DATE OF CONSULTATION:  04/23/2012  REFERRING PHYSICIAN:   CONSULTING PHYSICIAN:  Keturah Barre, NP  HISTORY OF PRESENT ILLNESS: Anita Fleming is a very pleasant 64 year old African American woman who has been admitted with malaise, fever, and hypotension. Her medical history is significant for end-stage renal disease with hemodialysis Tuesday, Thursday, and Saturday, has had history of multiple infections, fistulas, PermCath, DVTs, thrombosis in her legs, and has significant peripheral vascular disease which has required bilateral BKA. She is legally blind and uses a wheelchair. She has been admitted with malaise, fever, and hypotension with suspect PermCath infection. Gastroenterology has been consulted at the request of Dr. Felipa Furnace to evaluate for elevated LFTs and dilation of the pancreatic duct. The patient states this is the first time her liver enzymes have been high and that she has never had pancreatitis. She has a son with an unknown liver disease. Has had blood transfusions in the past, but otherwise denies liver illnesses, EtOH, illicits, incarceration, risky sexual behaviors, foreign travel, military health care service, tattoos, history of jaundice, and history of ascites. Noted negative hepatitis A and B testing. Does report a long history of diarrhea, but states this is improved some. Denies abdominal pain, black tarry/bloody stools, problems with acid reflux, heartburn, or indigestion. Does report some recent weight loss and occasional trouble swallowing, but unable to state how much diarrhea. She states the trouble swallowing is infrequent and not increasing. Did undergo a CT for her elevated LFTs and this demonstrated mild dilation of the extrahepatic bile duct which is likely related to her prior cholecystectomy and a dilated pancreatic duct that is enlarged the majority of its course measuring 8 mm  in greatest diameter, however, there was not a definite obstructing mass.   PAST MEDICAL HISTORY:  1. Coronary artery disease status post NSTEMI last hospitalization August 2013. 2. ESRD. 3. Infected AV graft, removed August 2013. Now has PermCath in left groin.  4. Peripheral vascular disease status post bilateral below-knee amputation. 5. Dyslipidemia. 6. Diabetes. 7. Multiple shunt infections. 8. Legally blind. 9. 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 10. Possible deep vein thrombosis in the past. 11. Cholecystectomy. 12. Thrombectomy.  13. Hysterectomy.   DRUG ALLERGIES: Zocor, Cozaar, cortisone.   SOCIAL HISTORY: Lives with son, gets around in wheelchair, former smoker, quit quite some time ago. No EtOH or illicits. Is widowed.   FAMILY HISTORY: Significant for diabetes. No known history of liver disease other than that in her brother and she is not sure what this is.   CURRENT MEDICATIONS:  1. Lipitor 10 mg p.o. daily.  2. ASA 325 mg p.o. daily.  3. Lomotil p.r.n. diarrhea. 4. Insulin sliding scale.  5. Metoprolol 12.5 mg p.o. daily.  6. Pentoxifylline 400 mg p.o. daily.  7. Sensipar 120 mg p.o. daily. 8. Sevelamer packet three times daily.  9. Vitamin D 50,000 units every week.   REVIEW OF SYSTEMS: Significant for fever, although the patient was unaware of this. Weight loss as noted above. Does not produce any urine at all. History of anemia, easy bruising. Ten-point review was negative in all other aspects.  LABS/RADIOLOGIC STUDIES: Most recent lab work: Glucose 127, BUN 26, creatinine 4.19, sodium 138, potassium 3.7, GFR 12, and calcium 8.1. Phosphorus 4.7. Magnesium 2.3. Lipase 156. Total protein 7.8, albumin 3, total bilirubin 0.8, alkaline phosphatase 231, AST 507, and ALT 171. WBC 8.7, hemoglobin 9.8, hematocrit 31.6, platelet  count 91, MCV and MCH are normal. Hepatitis A IgM negative. Hepatitis B surface antigen negative. Hepatitis B core antibody negative.   CT as noted  above.      PHYSICAL EXAMINATION:   VITAL SIGNS: Most recent vital signs: Temperature 101.1, pulse 97, respiratory rate 16, blood pressure 96/60, and oxygen saturation 94%.   GENERAL: Chronically ill appearing, pleasant African American woman, cachectic but in no acute distress.   HEENT: Normocephalic, atraumatic. No redness, drainage, or inflammation to the eyes or the nares.   NECK: Supple. No JVD, thyromegaly, or adenopathy.   LUNGS: Respirations eupneic. Lungs clear to auscultation and percussion.   CARDIAC: S1 and S2. Regular rate and rhythm. Grade 2/6 systolic murmur. No thrills. No gallop. No appreciable peripheral edema.   ABDOMEN: Flat. Bowel sounds x4. Nontender and nondistended. No hepatosplenomegaly, masses, guarding, rigidity, peritoneal signs, hernias, or other abnormalities.   GENITOURINARY: Deferred.   RECTAL: Deferred.   SKIN: Warm, dry, and pink. Does wear DuoDERM to her bilateral stumps. Otherwise, skin appears intact and is without erythema or lesion.   MUSCULOSKELETAL: No cyanosis. Strength 5/5. MAEW x4. Sensation appears to be intact.   NEUROLOGICAL: Alert and oriented x3. Cranial nerves II through XII grossly intact. Speech clear. No facial droop.   PSYCHIATRIC: Pleasant, cooperative, calm, some forgetfulness.  IMPRESSION AND PLAN:  1. Elevated LFTs and dilated pancreatic duct with current infection. ERCP may put the patient at increased risk so we will assess CA-19-9, CEA, and hepatitis C. Further recommendations to follow.  2. Chronic diarrhea. States this has been improving so we will follow with you.   These services were provided by Anita Pathristiane Rudie Sermons, Anita Fleming, [facility] in collaboration with Barnetta ChapelMartin Skulskie, M.D. with whom I have discussed this patient in full.  ____________________________ Keturah Barrehristiane H. Charma Mocarski, NP chl:slb D: 04/24/2012 08:27:02 ET T: 04/24/2012 08:56:20 ET JOB#: [id number]  cc: Keturah Barrehristiane H. Atira Borello, NP, <Dictator> Eustaquio MaizeHRISTIANE H Debe Anfinson  FNP ELECTRONICALLY SIGNED 04/30/2012 10:56

## 2014-09-07 NOTE — Consult Note (Signed)
Chief Complaint:   Subjective/Chief Complaint Patient seen and examined, please see full GI consult and consult note.  Consult for abnormal lfts and dilated pancreatic duct.  Review of chart indicates normal lfts as of this past august.  Patient did undergo a course of ancef as outpatient 2 months ago for graft infection.  No previous films to compare for dilated P duct. Of note patient also with relatively new thrombocytopenia.  Patient currently denies nausea or abdominal pain.  Tumor markers have been ordered, MRCP may be of some information, however woudl recheck lfts serially  first.  Following.   Electronic Signatures: Barnetta ChapelSkulskie, Martin (MD)  (Signed 04-Dec-13 19:25)  Authored: Chief Complaint   Last Updated: 04-Dec-13 19:25 by Barnetta ChapelSkulskie, Martin (MD)

## 2014-09-07 NOTE — Op Note (Signed)
PATIENT NAME:  Anita Fleming, Anita Fleming MR#:  [id number] DATE OF BIRTH:  1950-11-11  DATE OF PROCEDURE:  01/24/2012  PREOPERATIVE DIAGNOSIS: End-stage renal disease requiring hemodialysis.   POSTOPERATIVE DIAGNOSIS: End-stage renal disease requiring hemodialysis.   PROCEDURE PERFORMED:  1. Ultrasound guidance for vascular access, left femoral vein.  2. Fluoroscopic guidance for placement of catheter.  3. Placement of left femoral PermCath.   SURGEON: Annice NeedyJason S. Dominie Benedick, M.D.   ANESTHESIA: Local with moderate conscious sedation.   ESTIMATED BLOOD LOSS: 25 mL.  FLUOROSCOPY TIME: Less than one minute.   CONTRAST USED: 15 mL.   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 She now requires a PermCath and a groin PermCath will be placed. The risks and benefits were discussed and informed consent was obtained.   DESCRIPTION OF PROCEDURE: The patient was brought to the vascular interventional radiology suite. The groins were shaved and prepped and a sterile surgical field was created. The left femoral vein was visualized and found to be patent. It was then accessed under direct ultrasound guidance without difficulty with a Seldinger needle. A J-wire was placed. After skin and dilatation, the peel-away sheath was placed over the wire. A 50 cm tip to cuff tunneled hemodialysis catheter was then selected and tunneled from a counter incision in the thigh, placed through the peel-away sheath, and the peel-away sheath was removed. Fluoroscopic guidance was used to park the catheters in the retrohepatic vena cava up near the right atrium and it withdrew blood well and flushed easily with heparinized saline and then a concentrated heparin solution was placed. It was secured to the skin with two Prolene sutures. A 4-0 Monocryl pursestring suture was placed on the catheter exit site. 4-0 Monocryl was used to close the access site and  sterile dressing was placed. The patient tolerated the procedure well. ____________________________ Annice NeedyJason S. Rosaleah Person, MD jsd:slb D: 02/04/2012 12:11:00 ET T: 02/04/2012 12:17:14 ET JOB#: [id number]  cc: Annice NeedyJason S. Joselynne Killam, MD, <Dictator>  Annice NeedyJASON S Gladine Plude MD ELECTRONICALLY SIGNED 02/07/2012 8:16

## 2014-09-07 NOTE — Op Note (Signed)
PATIENT NAME:  Anita Fleming, Anita Fleming MR#:  [id number] DATE OF BIRTH:  03/02/1951  DATE OF PROCEDURE:  01/24/2012  PREOPERATIVE DIAGNOSES:  1. Endstage renal disease.  2. Status post excision of infected AV graft. 3. Hypertension.  4. Superior vena cava syndrome  5. Diabetes.   POSTOPERATIVE DIAGNOSES:    1. Endstage renal disease.  2. Status post excision of infected AV graft. 3. Hypertension.  4. Superior vena cava syndrome  5. Diabetes.   PROCEDURES:  1. Ultrasound guidance for vascular access, left femoral vein.  2. Fluoroscopic guidance for placement of catheter.  3. Placement of a 50 cm tip to cuff tunneled hemodialysis catheter, left femoral vein.   SURGEON: Jason S. Dew, M.D.   ANESTHESIA: Local with moderate conscious sedation.   ESTIMATED BLOOD LOSS: Approximately 25 mL. FLUOROSCOPY TIME: Approximately one minute.  CONTRAST USED: None.   INDICATION FOR PROCEDURE: This is a 64-year-old African American female with long-standing end-stage renal disease. She has a right jugular innominate occlusion. She had stents from her left subclavian vein and superior vena cava from previous superior vena cava syndrome. She had infection of her left arm AV graft and this had to be excised and she will require a PermCath for permanent dialysis access. She has a temporary catheter in the right groin, so her left femoral vein will be used for her dialysis needs.   DESCRIPTION OF PROCEDURE:   The patient is brought to the vascular interventional radiology suite. The left groin was sterilely prepped and draped, and a sterile surgical field was created. The left femoral vein was visualized with ultrasound and found to be widely patent. It was then accessed under direct ultrasound guidance without difficulty with a Seldinger needle and a J-wire was placed. After skin and dilatation, the peel-away sheath was placed over the wire. I then anesthetized an area several inches inferior and lateral to the access  incision, tunneled from this counter incision to the access site, selected a 50 cm tip to cuff tunneled hemodialysis catheter using fluoroscopic guidance. I placed this over the wire and through the peel-away sheath, removing the sheath and then the wire. The catheter tips were parked in the retrohepatic vena cava and up near the right atrium with the more distal tip. There were no kinks in the catheter. The appropriate distal connectors were placed. It withdrew blood well and flushed easily with heparinized saline and the concentrated heparin solution was then placed. It was secured to the skin with two Prolene sutures. The access incision was closed with a single 4-0 Monocryl and a single 4-0 Monocryl pursestring suture was placed around the exit site. Sterile dressing was placed. The patient tolerated the procedure well.    ____________________________ Jason S. Dew, MD jsd:ap D: 01/24/2012 11:11:24 ET T: 01/24/2012 12:19:40 ET JOB#: [id number]  cc: Jason S. Dew, MD, <Dictator>  JASON S DEW MD ELECTRONICALLY SIGNED 01/27/2012 14:42 

## 2014-09-07 NOTE — Consult Note (Signed)
PATIENT NAME:  Anita Fleming, Anita Fleming MR#:  [id number] DATE OF BIRTH:  Feb 18, 1951  DATE OF CONSULTATION:  05/05/2012  REFERRING PHYSICIAN: Dr. Tressia Miners.  CONSULTING PHYSICIAN:  Heinz Knuckles. Anita Peralta, MD  REASON FOR CONSULTATION: Gram-negative rod bacteremia.   HISTORY OF PRESENT ILLNESS: The patient is a 64 year old female with a past history significant for end-stage renal disease on hemodialysis, diabetes, status post bilateral BKAs, and recent MSSA bacteremia, who was admitted with a Klebsiella bacteremia. She was seen in the Emergency Room last week when she was having some irritability and malaise. She states that she was not having significant fevers, chills, or sweats. She was not having any nausea or vomiting. She had been going to dialysis without significant problem. She will occasionally have low blood pressure dialysis, but this is abnormal for her. She had been tolerating her dialysis without significant problem. In the Emergency Room, they obtained blood cultures and gave her a dose of vancomycin. Blood cultures subsequently grew Klebsiella species. She was admitted to the hospital and received a dose of ceftriaxone and gentamicin. She states that she is feeling fairly well and currently has no symptoms. She has had no trouble recently with her stumps. She has been admitted previously with methicillin sensitive Staphylococcus aureus bacteremia presumably related to hemodialysis. She has very few dialysis options and is currently being dialyzed via a Perm-A-Cath in the left groin. She has had no trouble, no pain or tenderness around the Perm-A-Cath site.   ALLERGIES: CORTISONE, COZAAR AND ZOCOR.   PAST MEDICAL HISTORY:  1.  End-stage renal disease on hemodialysis.  2.  Diabetes.  3.  Coronary artery disease with a recent non-ST elevation MI in August 2013.  4.  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  5.  Peripheral vascular  disease, status post bilateral BKAs.  6.  Hypercholesterolemia.  7.  Diabetes.  8.  Possible deep vein thrombosis.  9.  The patient is blind.  10. Status post cholecystectomy.  11. Status post hysterectomy.   SOCIAL HISTORY: She lives with her son. She gets around in a wheelchair. She is a prior smoker. She does not drink. She does not smoke.   FAMILY HISTORY: Positive for diabetes and hypertension.   REVIEW OF SYSTEMS:  GENERAL: No fevers, chills or sweats. Some malaise and irritability.  HEENT: No headaches or sinus congestion. No sore throat.  NECK: No stiffness. No swollen glands.  RESPIRATORY: No cough or shortness of breath. No sputum production.  CARDIAC: No chest pains or palpitations. No peripheral edema.  GASTROINTESTINAL: No nausea, no vomiting, no abdominal pain, no change in her bowels.  GENITOURINARY: She does not make much urine.  MUSCULOSKELETAL: No complaints.  SKIN: No rashes. No problems with the Perm-A-Cath in the left groin. She has had no recent problems around her stumps.  NEUROLOGIC: No focal weakness. She is legally blind. She did have some irritability on the day that she presented to the Emergency Room.  PSYCHIATRIC: No complaints. All other systems are negative.   PHYSICAL EXAMINATION:  VITAL SIGNS: T-max 98.9, T-current 98.9, pulse 78, blood pressure 124/77, 100% on room air.  GENERAL: A 64 year old, thin, black female in no acute distress.  HEENT: Normocephalic, atraumatic. Pupils equal and reactive to light. Extraocular motion intact. Sclerae, conjunctivae, and lids are without evidence for emboli or petechiae. Oropharynx shows no erythema or exudate. Gums are in fair condition.  NECK: Supple. Full range of motion. Midline trachea. No lymphadenopathy. No  thyromegaly.  LUNGS: Clear to auscultation bilaterally with good air movement. No focal consolidation.  HEART: Regular rate and rhythm without murmur, rub, or gallop.  ABDOMEN: Soft, nontender, and  nondistended. No hepatosplenomegaly. No hernia is noted.  EXTREMITIES: No evidence for tenosynovitis. She is status post bilateral BKAs. There was no tenderness around the stumps.  SKIN: No rashes. No stigmata of endocarditis, specifically no Janeway lesions or Osler nodes.  NEUROLOGIC: The patient was awake and interactive, moving all 4 extremities.  PSYCHIATRIC: Mood and affect appeared normal.   LABORATORY DATA: Shows a BUN of 35, creatinine of 6.30, bicarbonate 22, anion gap of 9. LFTs showed AST 15, ALT 19, alk phos 143, total bilirubin of 0.3. White count 8.4 with a hemoglobin 9.7, platelet count 128. No differential was obtained. I the Emergency Room, her white count was 8.1. Blood cultures from the Emergency Room are growing Klebsiella oxytoca. Blood cultures from 12/15 on admission are growing a gram-negative rod that is yet to be identified. Chest x-ray from the Emergency Room showed atherosclerotic disease with stents in place, but no other abnormalities. CT scan of the head without contrast showed no acute abnormalities.   IMPRESSION: A 64 year old female with a history of end-stage renal disease on hemodialysis, diabetes, status post bilateral below-knee amputations and recent methicillin-sensitive staphylococcus aureus bacteremia, who is admitted with a Klebsiella bacteremia.   RECOMMENDATIONS:  1.  She has had no problems with hemodialysis or with her most recent Perm-A-Cath. No fevers or chills at home. She was seen in the Emergency Room more for irritability than typical infectious symptoms. Her blood cultures were positive for Klebsiella, however.  2.  Repeat blood cultures on admission are still growing a gram-negative rod, but she had only been on vancomycin, which would not be expected to have any activity against Klebsiella.  3.  Will continue ceftriaxone for now. We will increase the dose to 2 grams a day.  4.  We will likely be able to discharge her on oral Cipro, which is 100%  bioavailable.  5.  Her prior fistula sites look okay.  6.  We will repeat the blood cultures tomorrow. If they remain positive, we will need to consider removing the Perm-A-Cath. If the blood cultures is clear, then we can try and keep the catheter.   Thank you very much for involving me in the patient's care.     ____________________________ Heinz Knuckles. Ethin Drummond, MD meb:aw D: 05/05/2012 15:37:39 ET T: 05/06/2012 10:36:59 ET JOB#: [id number]  cc: Heinz Knuckles. Zi Sek, MD, <Dictator> Demaris Bousquet E [place] Agent MD ELECTRONICALLY SIGNED 05/06/2012 14:50

## 2014-09-07 NOTE — Consult Note (Signed)
Chief Complaint:   Subjective/Chief Complaint patietn seen for abnormal lfts and dilated Pancreatic duct.  Patient doing better, denies abd pain or nausea, tolerating po, appetite better than on admission.   VITAL SIGNS/ANCILLARY NOTES: **Vital Signs.:   06-Dec-13 15:41   Vital Signs Type Routine   Temperature Temperature (F) 97.8   Celsius 36.5   Pulse Pulse 83   Respirations Respirations 18   Systolic BP Systolic BP 100   Diastolic BP (mmHg) Diastolic BP (mmHg) 75   Mean BP 83   Pulse Ox % Pulse Ox % 100   Oxygen Delivery Room Air/ 21 %  *Intake and Output.:   06-Dec-13 13:09   Stool  large soft brown stool .    14:54   Stool  large loose brown stool .   Brief Assessment:   Cardiac Regular    Respiratory clear BS    Gastrointestinal details normal Soft  Nontender  Nondistended  No masses palpable  Bowel sounds normal   Lab Results: Hepatic:  06-Dec-13 05:15    Bilirubin, Total 0.5   Bilirubin, Direct 0.2 (Result(s) reported on 25 Apr 2012 at 08:16AM.)   Alkaline Phosphatase  161   SGPT (ALT) 57   SGOT (AST)  53   Total Protein, Serum 7.1   Albumin, Serum  2.5  Routine Coag:  06-Dec-13 05:15    Prothrombin  16.9   INR 1.3 (INR reference interval applies to patients on anticoagulant therapy. A single INR therapeutic range for coumarins is not optimal for all indications; however, the suggested range for most indications is 2.0 - 3.0. Exceptions to the INR Reference Range may include: Prosthetic heart valves, acute myocardial infarction, prevention of myocardial infarction, and combinations of aspirin and anticoagulant. The need for a higher or lower target INR must be assessed individually. Reference: The Pharmacology and Management of the Vitamin K  antagonists: the seventh ACCP Conference on Antithrombotic and Thrombolytic Therapy. Chest.2004 Sept:126 (3suppl): L7870634. A HCT value >55% may artifactually increase the PT.  In one study,  the increase was an  average of 25%. Reference:  "Effect on Routine and Special Coagulation Testing Values of Citrate Anticoagulant Adjustment in Patients with High HCT Values." American Journal of Clinical Pathology 2006;126:400-405.)  Routine Hem:  06-Dec-13 05:15    WBC (CBC) 8.3   RBC (CBC)  3.77   Hemoglobin (CBC)  10.4   Hematocrit (CBC)  33.3   Platelet Count (CBC)  100   MCV 88   MCH 27.6   MCHC  31.3   RDW  17.6   Neutrophil % 86.2   Lymphocyte % 7.0   Monocyte % 5.4   Eosinophil % 0.7   Basophil % 0.7   Neutrophil #  7.2   Lymphocyte #  0.6   Monocyte # 0.5   Eosinophil # 0.1   Basophil # 0.1 (Result(s) reported on 25 Apr 2012 at 05:44AM.)   Assessment/Plan:  Assessment/Plan:   Assessment 1) abnormal lfts-improving-etiology likely due to hypoperfusion/possible hypotension/sirs 2) 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  no evidence of mass on ct or [place] 3) dilated pancreatic duct-no evidence of mass or other pancreatic lesion.  Mild Elevated ca19-9.  Both findings could be due to chronic pancreatitis as well as intraductal neoplasia.  Patietn is a poor candidate for invasive study such as ERCP or EUS.  Will order MRCP as clinically feasible.  Suggest serial fu of ca 19-9 in about 1 month, 2  month.    Plan as above.  I will not be available until monday,  call GI on call if needed.   Electronic Signatures: Barnetta ChapelSkulskie, Martin (MD)  (Signed 06-Dec-13 17:59)  Authored: Chief Complaint, VITAL SIGNS/ANCILLARY NOTES, Brief Assessment, Lab Results, Assessment/Plan   Last Updated: 06-Dec-13 17:59 by Barnetta ChapelSkulskie, Martin (MD)

## 2014-09-07 NOTE — Op Note (Signed)
PATIENT NAME:  Anita Fleming, [name] MR#:  [id number] DATE OF BIRTH:  12-Aug-1950  DATE OF PROCEDURE:  01/17/2012  PREOPERATIVE DIAGNOSES:  1. Renal failure.  2. Possible left arm arteriovenous graft infection.  3. Hypertension.  4. Central venous occlusion   POSTOPERATIVE DIAGNOSES:  1. Renal failure.  2. Possible left arm arteriovenous graft infection.  3. Hypertension.  4. Central venous occlusion   PROCEDURE PERFORMED:   1. Ultrasound guidance for vascular access to right jugular vein and right femoral vein.  2. Right jugular needle venogram.  3. Right femoral needle venogram.  4. Placement of Trialysis catheter, right femoral vein.   SURGEON: Annice NeedyJason S. Dew, MD   ANESTHESIA: Local.   ESTIMATED BLOOD LOSS: Approximately 25 mL.   FLUOROSCOPY TIME:  Approximately 1 minute.   CONTRAST USED: 15 mL.   INDICATION FOR PROCEDURE: The patient is a 64 year old African American female with end-stage renal disease. She was admitted with fever and likely infection of her left arm AV graft. This will need to be rested for dialysis and treated with antibiotics, if not resected. She needs dialysis, and a previous attempt at a jugular Trialysis catheter placed at the bedside was unsuccessful. She is brought down for a jugular venogram to see if this will be possible; and if not, a femoral Trialysis catheter will be placed. The risks and benefits were discussed. Informed consent was obtained.   DESCRIPTION OF PROCEDURE: The patient was brought to the vascular interventional radiology suite. Initially the right neck and chest were sterilely prepped and draped, and a sterile surgical field was created. In the neck, the jugular vein was patent and was accessed with a needle, but a wire would not pass. A needle jugular venogram showed an occlusion of the jugular vein at about the level of the clavicles. The patient also had stents from her left subclavian vein to the superior vena cava, and for this reason it was  not felt that the left jugular vein had any chance of being used and likely that her right jugular vein was also now not useful for any further access. At this point, I abandoned the jugular approach and went to the groins. The right femoral vein was visualized with ultrasound. It was difficult to visualize but did appear to be patent. It was then accessed and a needle venogram was performed. This showed a patent femoral iliac and up to the cava. I then placed a Trialysis type dialysis catheter over a J-wire after  skin nick and dilatation. All three lumens withdrew blood well and flushed easily with heparinized saline, and then concentrated heparin solution was then placed and secured with 3 Prolene sutures.    ____________________________ Annice NeedyJason S. Dew, MD jsd:cbb D: 01/17/2012 15:51:46 ET T: 01/17/2012 18:35:15 ET JOB#: [id number]  cc: Annice NeedyJason S. Dew, MD, <Dictator> Annice NeedyJASON S DEW MD ELECTRONICALLY SIGNED 01/23/2012 10:03

## 2014-09-07 NOTE — H&P (Signed)
PATIENT NAME:  Anita Fleming, Anita Fleming MR#:  [id number] DATE OF BIRTH:  10-10-1950  DATE OF ADMISSION:  04/22/2012  REASON FOR ADMISSION: Malaise, fever, and hypotension.    PRIMARY CARE PHYSICIAN: Laruth Bouchard, MD    REFERRING PHYSICIAN: Maricela Bo, MD   HISTORY OF PRESENT ILLNESS: Ms. Siever is a very nice 64 year old female who has history of end-stage renal disease on hemodialysis Tuesday, Thursday, and Saturday. She also has history of multiple infections of fistulas and PermCath, DTs and thrombosis of IV grafts, and significant peripheral vascular disease. She is legally blind and gets around in a wheelchair. Apparently these last couple of days she has been laying down on the bed "more than usual". Apparently the patient whenever she gets sick she gets really tired and lays down on the bed and doesn't do anything. As per the son, there have not been any other changes in her health lately. She was recently admitted over here on 01/15/2012 due to possible infection that at the moment was thought to be on her AV graft. At that moment the AV graft was removed and the patient was placed on PermCath of her left inguinal area. She was discharged with that and that healed. She was discharged to skilled nursing facility for rehabilitation only for 1 or 2 weeks and then she has been at home. She was in her normal state of health prior to the last couple of days. This morning she went to dialysis. She had three hours of dialysis, was discharged on her dry weight and apparently did not have any fluid removed and it has been over a month since she had any fluid removed she says. She was sent home after the dialysis and whenever her home health aide came to see her she decided to call and get her transferred into the ER at [facility] due to the patient being slightly tachycardic, feeling hot to touch, and being really tired and debilitated.   REVIEW OF SYSTEMS: 12 system review of systems is done. The patient denies any  fever, although her temperature was elevated today and she was told that she was warm this morning. No weight loss, weight gain. EYES: The patient is blind. No pain in her eyes. ENT: No tinnitus. No difficulty swallowing. RESPIRATORY: The patient denies any cough, although she coughs every time that she takes a really deep breath. CARDIOVASCULAR: Denies chest pain, orthopnea, or syncopal episodes. GI: No nausea, vomiting, or abdominal pain. GU: The patient does not produce any urine at all. ENDOCRINE: No polyuria, polydipsia, or polyphagia. No thyroid problems, cold or heat intolerance. HEME/LYMPH: Positive chronic disease anemia. Positive easy bruising. Denies any bleeding or swollen glands. SKIN: Without any significant rashes or lesions. MUSCULOSKELETAL: Positive bilateral below-the-knee amputations due to peripheral vascular disease. NEUROLOGIC: No numbness or weakness. No TIAs. PSYCHIATRIC: No significant depression or anxiety.   PAST MEDICAL HISTORY:  1. Coronary artery disease status post non-ST elevation MI on her last hospitalization in August of 2013.  2. End-stage kidney disease.  3. History of infection of AV graft which had been removed in August of 2013 and now she has a PermCath on her left groin.  4. Peripheral vascular disease.  5. Status post bilateral below-the-knee amputations.  6. Dyslipidemia.  7. Diabetes.  8. Multiple shunt infections.  9. Legally blind.  10. 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  11. Possible DVT. The patient is not quite sure.   PAST SURGICAL HISTORY:  1. Cholecystectomy.  2. Thrombectomy.  3. Hysterectomy.  4. Bilateral below-the-knee amputations.  5. History of previous graft placement for dialysis.   ALLERGIES: The patient is allergic to Zocor, Cozaar, and cortisone.   SOCIAL HISTORY: The patient actually lives with her son. She gets around on motorized wheelchair. She is a former smoker but she has not smoked in a long time. She  does not use drugs or alcohol. She is a widow.   FAMILY HISTORY: Positive for diabetes in all her brothers and sisters and high blood pressure. Denies cancer or coronary artery disease.   CURRENT MEDICATIONS:  1. Lipitor 10 mg once daily.  2. Aspirin 325 mg once daily.  3. Atropine-diphenoxylate p.r.n. diarrhea. 4. Insulin regular injectable sliding scale. 5. Metoprolol 12.5 mg once daily.  6. Pentoxifylline 400 mg once a day.  7. Sensipar 120 mg once daily.  8. Sevelamer one packet 3 times a day. 9. Vitamin D 50,000 units once a week.   PHYSICAL EXAMINATION:   VITAL SIGNS: Blood pressure dropping down to 76/49, pulse 108, respiratory rate 16, temperature 100.6, saturating 98% on room air.   GENERAL: The patient is alert, oriented x3. No acute distress. Chronically ill looking.   HEENT: Her pupils are equal. Extraocular movements are intact. She is legally blind. No oral lesions. No oropharyngeal exudates. Her mucosa is dry.   NECK: Supple. No JVD. No thyromegaly. No adenopathy. No carotid bruits.   CARDIOVASCULAR: Regular rate and rhythm, tachycardic. Systolic ejection murmur 2 out of 6. No thrills. No tenderness to palpation of the chest. No displacement of PMI.   LUNGS: Clear without any wheezing or crepitus. No dullness to percussion. No use of accessory muscles.   ABDOMEN: Soft, nontender, nondistended. No hepatosplenomegaly. No masses. Bowel sounds are positive. No tenderness to palpation of the right upper quadrant or percussion of the liver.   GENITAL: Negative for external lesions.   EXTREMITIES: PermCath on the left lower extremity. Groin does not show any acute signs of infection. Pulses in upper extremities +2.   MUSCULOSKELETAL: The patient has bilateral below-the-knee amputations that are healed.   NEUROLOGIC: Cranial nerves II through XII are intact. Strength is 5 out of 5 in four extremities.   SKIN: Without any rashes or petechiae.   LYMPHATIC: Negative for  lymphadenopathy in neck or supraclavicular areas.   PSYCH: Mood is normal without any signs of anxiety or agitation. The patient is actually very hungry. Alert and oriented x3.   LABORATORY, DIAGNOSTIC, AND RADIOLOGICAL DATA: Creatinine 3.41, sodium 136, potassium 3.2, calcium 8.0, lipase 156, albumin 3, alkaline phosphatase 231, AST 507, ALT 171, hemoglobin 10.4, white count 8.1. Her baseline is usually around 5 and sometimes below 5. Her platelets are decreased at 89.   On EKG the patient has first degree AV block. She has intraventricular conduction defect. No ST depression or elevation. Changes are likely due to left bundle branch. No changes from previous EKGs.   CT of the abdomen and pelvis shows pancreatic duct dilation without definite obstruction mass, could be related to chronic pancreatitis versus intraductal papillary mucinous neoplasm. There are a few loops of mildly prominent but not dilated. There is some changes with dilation of the biliary duct likely related to her previous cholecystectomy.   ASSESSMENT AND PLAN: This is a 64 year old female with history of multiple previous infections on her AV grafts. She does have end-stage renal disease, diabetes, peripheral vascular disease, and she comes with fever, hypotension, and malaise. The patient had a temperature of  100.4.  1. SIRS. The patient has symptoms of systemic inflammatory response syndrome likely due to an infection that could be in her bloodstream. Her temperature is 100.6 She is tachycardic. She had significant debility and she has actually an increase in her white count from her baseline. She is chronically immunosuppressed for what an 8000 white blood cells could be significant for her. She has also significant decline of her general function. She looks dehydrated and she is hypotensive with blood pressures in the 70's over 40's that have improvement with IV fluids. The patient is going to be covered with broad-spectrum  antibiotics, Zosyn and vancomycin, to cover any infection of the PermCath. We are going to add on an ID consultation with Dr. Leavy CellaBlocker since he has seen the patient before and renal consult as well for her dialysis. The patient is to have medications dosed based on her dialysis status.  2. Hypotension likely due to dehydration and SIRS. IV fluids given gently. Monitor blood pressure closely.  3. Tachycardia likely due to SIRS and hypotension. IV fluids continuously.  4. Diabetes. Continue treatment with insulin sliding scale for now.  5. Dilation of the pancreas. We are going to add on a GI consultation.  6. Elevated LFTs. At this moment since the patient is a dialysis patient could be the acute hepatitis, although the patient doesn't exhibit any clinical symptoms, there is no jaundice, there is no pain. We are going to add on a liver profile for this and follow-up also GI consultation.  7. History of peripheral vascular disease. The patient is to continue aspirin and Trental.  8. End-stage renal disease. Continue dialysis and hemodialysis on Tuesday, Thursday, and Saturday.  9. Other medical problems seem to be stable.   CODE STATUS: The patient is a FULL CODE.   TIME SPENT: I spent about 60 minutes with this admission today. So far placement of the infection is undefinitive. We are going to repeat a chest x-ray tomorrow in the morning to make sure she does not have any occult pneumonia and follow-up blood cultures.   ____________________________ Felipa Furnaceoberto Sanchez Gutierrez, MD rsg:drc D: 04/22/2012 18:30:01 ET T: 04/23/2012 05:27:31 ET JOB#: [id number]  cc: Felipa Furnaceoberto Sanchez Gutierrez, MD, <Dictator> Tamika J. Lazarus SalinesLott, MD Regan RakersOBERTO Juanda ChanceSANCHEZ GUTIERRE MD ELECTRONICALLY SIGNED 04/23/2012 15:42

## 2014-09-07 NOTE — Op Note (Signed)
PATIENT NAME:  Anita Fleming, Anita Fleming MR#:  [id number] DATE OF BIRTH:  Mar 31, 1951  DATE OF PROCEDURE:  01/21/2012  PREOPERATIVE DIAGNOSES:  1. Infected left arm brachial axillary dialysis graft.  2. End-stage renal disease.  3. Peripheral vascular disease status post bilateral amputations.  4. History of cerebrovascular accident.   POSTOPERATIVE DIAGNOSES:  1. Infected left arm brachial axillary dialysis graft.  2. End-stage renal disease.  3. Peripheral vascular disease status post bilateral amputations.  4. History of cerebrovascular accident.   PROCEDURE PERFORMED: Excision infected left arm brachial axillary dialysis graft.   SURGEON: Renford Dills, MD  ANESTHESIA: General by LMA.   FLUIDS: Per anesthesia record.   ESTIMATED BLOOD LOSS: Minimal.   SPECIMEN: Segments of graft and FLAIR stents removed, photographed for surgical record.   INDICATIONS: Anita Fleming is a 64 year old woman who presented to the hospital with high fevers, shaking chills and multiple positive blood cultures. She has been resuscitated and has improved, however, there is a small area of the left arm brachial axillary dialysis graft that appears to have purulent drainage. This is the most likely source for her infection and she been counseled to undergo excision to remove the infected nidus. The risks and benefits were reviewed. All questions answered. Patient has agreed to proceed.   DESCRIPTION OF PROCEDURE: Patient is taken to the Operating Room, placed in supine position. After adequate general anesthesia is induced, appropriate invasive monitors are placed patient is positioned supine with her left arm extended palm upward. Left arm is prepped and draped in a circumferential fashion.   0.25% Marcaine with epinephrine is then infiltrated in the soft tissue surrounding both arterial and venous anastomosis as well as the graft working from the tube anastomosis toward the central infected portion.   Linear incision  is then created through the previous incisional scar reopening this area and dissecting the arterial anastomosis circumferentially. Satinsky clamp is then placed across the anastomosis and the graft is transected. It is then repaired with a running CV-5 Gore suture. Flushing maneuvers are performed and flow is re-established to the hand, distally pulses 4/4+. The graft is then dissected circumferentially for approximately 1 cm. highly ligated with silk tie and transected. It is then tucked up into that area and oversewn in three layers isolating the brachial artery from the remaining dissection. In a similar fashion the venous anastomosis is addressed again clamping, transecting the graft, removing the graft as an isolated segment. Also at this time the FLAIR stent was removed. Back bleeding was allowed to back flush the vein. The vein was then oversewn with CV-5 Gore suture. Again this incision in the axillary area was closed in three layers to isolate it from the remaining dissection. Now working more toward the apex of the graft two more counterincisions are made, one over the arterial portion of the graft, one over the venous portion and again a segment of graft resected and these two incisions oversewn deeply.   Elliptical incision is then made around the infected portion of the graft and the dissection is carried down to expose this segment of the graft in one piece the remaining portion of the graft is removed in its entirety. This area is then irrigated. Hemostasis obtained with Bovie cautery and subsequently VAC dressing is applied. Skin is closed over the previous four described incisions with surgical staples. Patient tolerated procedure well. There were no immediate complications. Sponge and needle counts are correct and she is taken back to the recovery area  in excellent condition.   ____________________________ Renford DillsGregory G. Wyley Hack, MD ggs:cms D: 01/21/2012 13:44:57 ET T: 01/22/2012 09:07:41  ET JOB#: [id number] cc: Renford DillsGregory G. Nakkia Mackiewicz, MD, <Dictator> Munsoor Lizabeth LeydenN. Lateef, MD Thomos Lemonsamika J. Lazarus SalinesLott, MD Renford DillsGREGORY G Andreanna Mikolajczak MD ELECTRONICALLY SIGNED 01/25/2012 16:24

## 2014-09-07 NOTE — Discharge Summary (Signed)
PATIENT NAME:  Anita Fleming, [name] MR#:  [id number] DATE OF BIRTH:  1950-09-07  DATE OF ADMISSION:  04/22/2012 DATE OF DISCHARGE:  04/27/2012  ADMISSION DIAGNOSES: Hypotension with systemic inflammatory response syndrome.   DISCHARGE DIAGNOSES:  1. Systemic inflammatory response syndrome with fever and hypotension.  2. End-stage renal disease on hemodialysis.  3. Diabetes.  4. Hyperlipidemia.  5. Peripheral vascular disease.  6. Chronic diarrhea.   CONSULTS:  1. Dr. Leavy CellaBlocker.  2. Dr. Thedore MinsSingh.   LABORATORY, DIAGNOSTIC AND RADIOLOGICAL DATA: Laboratories at discharge: C. difficile was negative. M.R.C.P. common bile duct mildly dilated, pancreatic ductal dilation less conspicuous than on recent prior CT. There is a dilated pancreatic side branch.   HOSPITAL COURSE: 64 year old female presented with hypotension and SIRS. For further details, please refer to the history and physical by Dr. Mordecai MaesSanchez.  1. Fevers with SIRS. Unclear etiology, possibly viral. Patient also had hypotension with elevated LFTs and dilated pancreatic duct which was present on admission. She was started on vancomycin and Zosyn empirically. Her blood cultures are negative. I appreciate ID consult. Her LFTs have much improved. Her blood pressure also was improved. Will restart low-dose beta blocker at this time. There no suspected infection.  2. End-stage renal disease on hemodialysis. Patient did receive dialysis here.  3. Diabetes. No issues.  4. Hyperlipidemia. On atorvastatin.  5. History of peripheral vascular disease and bilateral below-knee amputation. 6. Diarrhea. Patient reports this is chronic. Her C. difficile was negative. Patient does not want skilled nursing facility. She also refused home health care.   DISCHARGE MEDICATIONS:  1. Atorvastatin 10 mg at bedtime.  2. Atropine diphenoxylate 0.025 mg/2.5 mg 1 tablet q.8 hours p.r.n. diarrhea.  3. Sensipar 60 mg 2 tablets in the morning, 1 tablet in the evening.   4. Vitamin D2 50,000 international units weekly on Saturday.  5. Aspirin 325 mg daily.  6. Levemir 2.4 grams t.i.d.  7. Pentoxifylline 400 mg daily.   8. Insulin regular injectable.  9. Metoprolol 25 mg 1/2 tablet daily.   DISCHARGE DIET: Renal.   DISCHARGE ACTIVITY: As tolerated. Patient refused home health.   DISCHARGE FOLLOW UP: Patient will need to follow with Dr. Juel BurrowMasoud in one week.   TIME SPENT: 35 minutes.   ____________________________ Janyth ContesSital P. Juliene PinaMody, MD spm:cms D: 04/27/2012 10:32:08 ET T: 04/27/2012 12:31:48 ET JOB#: [id number]  cc: Chanoch Mccleery P. Juliene PinaMody, MD, <Dictator> Corky DownsJaved Masoud, MD  Janyth ContesSITAL P Zuri Bradway MD ELECTRONICALLY SIGNED 04/28/2012 7:33

## 2014-09-07 NOTE — Consult Note (Signed)
Chief Complaint:   Subjective/Chief Complaint patient seen in floor room before this afternoons transfer to CCU.  main c/o being cold.  no abdominal pain or n/v.   VITAL SIGNS/ANCILLARY NOTES: **Vital Signs.:   05-Dec-13 13:30   Vital Signs Type Post Dialysis   Temperature Temperature (F) 100.8   Celsius 38.2   Pulse Pulse 110   Respirations Respirations 20   Systolic BP Systolic BP 388   Diastolic BP (mmHg) Diastolic BP (mmHg) 69   Mean BP 94   Pulse Ox % Pulse Ox % 97   Oxygen Delivery Room Air/ 21 %   Brief Assessment:   Cardiac Regular    Respiratory clear BS    Gastrointestinal details normal Soft  Nontender  Nondistended  No masses palpable  Bowel sounds normal   Lab Results: Hepatic:  03-Dec-13 14:22    Bilirubin, Total 0.8   Alkaline Phosphatase  231   SGPT (ALT)  171   SGOT (AST)  507   Total Protein, Serum 7.8   Albumin, Serum  3.0  05-Dec-13 04:16    Bilirubin, Direct 0.2 (Result(s) reported on 24 Apr 2012 at 06:15AM.)   Bilirubin, Total 0.4   Alkaline Phosphatase  192   SGPT (ALT)  80   SGOT (AST)  97   Total Protein, Serum 7.0   Albumin, Serum  2.6  General Ref:  03-Dec-13 19:42    Hepatitis Panel A, B, C ========== TEST NAME ==========  ========= RESULTS =========  = REFERENCE RANGE =  HEPATITIS PANEL A, B, C  HP5+HAVIgM+HBcIgM Hep A Ab, IgM                   [   Negative             ]          Negative Hep A Ab, Total                 [   Positive             ]          Negative HBsAg Screen                    [   Negative             ]          Negative Hep B Core Ab, IgM              [   Negative             ]          Negative Hep B Core Ab, Tot              [   Negative             ]         Negative Hep B Surface Ab, Qual          [   Reactive             ]                                                Non Reactive: Inconsistent with immunity,  less than 10 mIU/mL            Reactive:      Consistent with immunity,                                            greater than 9.9 mIU/mL HCV Ab                          [   0.1 s/co ratio       ]           0.0-0.9 Comment:                        [   Final Report    ]                   Non reactive HCV antibody screen is consistent with no HCV infection, unless recent infection is suspected or other evidence exists to indicate HCV infection.               [facility]            No: [id number]          [address] [place], [place] [address]           Lindon Romp, MD         [phone]   Result(s) reported on 24 Apr 2012 at 09:25AM.  Routine Chem:  05-Dec-13 04:16    Lipase 150 (Result(s) reported on 24 Apr 2012 at 06:15AM.)   Glucose, Serum  125   BUN  36   Creatinine (comp)  5.29   Sodium, Serum 136   Potassium, Serum 4.1   Chloride, Serum 102   CO2, Serum 26   Calcium (Total), Serum  7.6   Osmolality (calc) 282   eGFR (African American)  9   eGFR (Non-African American)  8 (eGFR values <16m/min/1.73 m2 may be an indication of chronic kidney disease (CKD). Calculated eGFR is useful in patients with stable renal function. The eGFR calculation will not be reliable in acutely ill patients when serum creatinine is changing rapidly. It is not useful in  patients on dialysis. The eGFR calculation may not be applicable to patients at the low and high extremes of body sizes, pregnant women, and vegetarians.)   Anion Gap 8  Routine Coag:  05-Dec-13 04:16    Prothrombin  16.7   INR 1.3 (INR reference interval applies to patients on anticoagulant therapy. A single INR therapeutic range for coumarins is not optimal for all indications; however, the suggested range for most indications is 2.0 - 3.0. Exceptions to the INR Reference Range may include: Prosthetic heart valves, acute myocardial infarction, prevention of myocardial infarction, and combinations of aspirin and anticoagulant. The need for a higher  or lower target INR must be assessed individually. Reference: The Pharmacology and Management of the Vitamin K  antagonists: the seventh ACCP Conference on Antithrombotic and Thrombolytic Therapy. CHKVQQ.5956Sept:126 (3suppl): [id number] A HCT value >55% may artifactually increase the PT.  In one study,  the increase was an average of 25%. Reference:  "Effect on Routine and Special Coagulation Testing Values of Citrate Anticoagulant Adjustment in Patients with High HCT Values." American Journal of Clinical Pathology 2006;126:400-405.)   Assessment/Plan:  Assessment/Plan:   Assessment 1) abnormal  liver tests- improving/normalizing. 2) dilated pancreatic duct-no apparent mass on CT.  minimal elevation of Ca19-9, with normal CEA.  Question of possible chronic pancreatitis or even IPMN (intraductal pancreatic mucinous neoplasm).  Patient is a poor candidate for ERCP or EUS.  MRCP may better define the duct, will consider after current clinical situation improves.    Plan as above.   Electronic Signatures: Loistine Simas (MD)  (Signed 05-Dec-13 17:51)  Authored: Chief Complaint, VITAL SIGNS/ANCILLARY NOTES, Brief Assessment, Lab Results, Assessment/Plan   Last Updated: 05-Dec-13 17:51 by Loistine Simas (MD)

## 2014-09-07 NOTE — Discharge Summary (Signed)
PATIENT NAME:  Anita Fleming, Anita Fleming MR#:  [id number] DATE OF BIRTH:  Sep 26, 1950  DATE OF ADMISSION:  05/04/2012 DATE OF DISCHARGE:  05/08/2012  FAMILY PHYSICIAN: Dr. Lazarus Fleming.  CONSULTATION PHYSICIAN: Dr. Leavy Fleming and Dr. Cherylann Fleming.  DISCHARGE DIAGNOSES: Gram-negative bacteremia, end-stage renal disease, on dialysis, peripheral vascular disease, coronary artery disease, diabetes.   CONDITION: Stable.   CODE STATUS: Full code.   MEDICATIONS: [facility]. New medication: The patient will be placed on Cipro 500 mg p.o. daily for 14 days for gram-negative bacteremia.   ACTIVITY: As tolerated.   DIET: Low sodium, low fat, low cholesterol, ADA renal diet.   FOLLOWUP CARE: Follow up with PCP, Dr. Leavy Fleming and Dr. Cherylann Fleming within 1 to 2 weeks.   REASON FOR ADMISSION: Bacteremia.   HOSPITAL COURSE: The patient is a 64 year old African-American female with a history of ESRD, on dialysis, was discharged on 04/27/2012 due to fever and hypotension. The patient was called to be readmitted to the hospital after 2 blood cultures grew gram-negatives rods. The patient denies any fever, chills. Laboratory on admission date showed glucose 132, BUN 35, creatinine 6.2, sodium 139, potassium 5.3, bicarbonate 22. CBC showed WBC 8000, hemoglobin 9.7, platelets 122. Blood culture on December 14 is positive for gram-negative rods. The patient was admitted for gram-negative bacteremia, but the source is unclear. For detailed history and physical examination, please refer to the admission note dictated by Anita Fleming. After admission, the patient was treated with Rocephin IV, which was increased to 2 grams daily, according to Dr. Sharrell Fleming's recommendation. Dr. Leavy Fleming recommend repeat blood culture. If negative, the patient may be discharged with 2 weeks of Cipro p.o.; however, if it is positive, the patient may have a PermCath infection which would need to be removed. The  patient's blood culture so far has been negative for the past 48 hours, so Dr. Leavy Fleming suggested the patient may be discharged with p.o. Cipro for 2 weeks. The patient's white count today is 15. The patient may follow up with PCP and Dr. Leavy Fleming as outpatient. The patient has no complaints. Vital signs are stable. She is clinically stable and will be discharged to home today. In addition, the patient underwent hemodialysis during this hospitalization. I discussed the patient's discharge plan with the patient and the case manager.   TIME SPENT: About 33 minutes.    ____________________________ Anita PollackQing Treson Laura, MD qc:OSi D: 05/08/2012 16:20:24 ET T: 05/09/2012 08:04:29 ET JOB#: [id number]  cc: Anita PollackQing Shivansh Hardaway, MD, <Dictator> Anita PollackQING Rheana Casebolt MD ELECTRONICALLY SIGNED 05/09/2012 16:45

## 2014-09-10 NOTE — Op Note (Signed)
PATIENT NAME:  [name], [name] MR#:  [id number] DATE OF BIRTH:  08-25-1950  DATE OF PROCEDURE:  07/30/2012  PREOPERATIVE DIAGNOSIS: Necrotizing soft tissue infection, right lower extremity.   POSTOPERATIVE DIAGNOSIS: Necrotizing soft tissue infection, right lower extremity.   PROCEDURE PERFORMED: Sharp excisional debridement of fascia, with initial application of wound vacuum-assisted closure device greater than 50 sq cm.   SURGEON: Chioma Mukherjee A. Egbert GaribaldiBird, MD   ASSISTANT: None.   ANESTHESIA: General.   FINDINGS: There was no further evidence of necrotizing soft tissue infection. There was some necrotic fat along the fascia with necrotic fascia which was tangentially excised. The wound measured at the completion of the operation 1 cm in depth by 19 cm and 14 cm. It was located along the posterior right thigh.   ESTIMATED BLOOD LOSS: Minimal.   DESCRIPTION OF PROCEDURE: With the patient in the supine position, general anesthesia was induced. The existing dressing was removed. The leg was sterilely prepped and draped with Betadine solution. Timeout was observed. Excisional sharp debridement was performed with scalpel of fascia and necrotic fat. Hemostasis was obtained with point cautery. The wound was further debrided with a curette. There was no further obvious evidence of necrotizing soft tissue infection. Silver-impregnated black foam was cut to the shape of the wound and placed into the wound with the assistance of Ioban and bio-occlusive dressing. Track-pad was placed. Negative pressure was applied to the wound with the wound VAC assisted closure device at -125 cm of pressure. The patient was then subsequently extubated and taken to the recovery room in stable and satisfactory condition by anesthesia services.   ____________________________ Redge GainerMark A. Egbert GaribaldiBird, MD mab:OSi D: 07/31/2012 10:54:09 ET T: 07/31/2012 11:12:05 ET JOB#: [id number]  cc: Loraine LericheMark A. Egbert GaribaldiBird, MD, <Dictator> Raynald KempMARK A Jeny Nield MD ELECTRONICALLY SIGNED  08/05/2012 19:35

## 2014-09-10 NOTE — Consult Note (Signed)
PATIENT NAME:  Anita Fleming, Anita Fleming MR#:  [id number] DATE OF BIRTH:  02/04/1951  DATE OF CONSULTATION:  07/26/2012  CONSULTING PHYSICIAN:  Parvin Stetzer T. Corona Popovich, MD  HISTORY OF PRESENT ILLNESS: The patient is a 64-year-old female with multiple medical problems to include severe diabetes with end-stage renal dysfunction requiring chronic dialysis, as well as hypertension, peripheral vascular disease and coronary artery disease. She has also had 2 previous below-knee amputations and is a nonambulator at this time. The patient was admitted for significant right posterior thigh soft tissue infection. X-ray examination demonstrated what appeared to be a chronic right supracondylar femur fracture. Orthopedics was consulted.   PHYSICAL EXAMINATION: Upon examination, the patient has healed bilateral below-knee amputations with significant flexion contractures of both knees. She is nontender to palpation about her right distal femur. She does have a large soft tissue defect more proximally along her posterior thigh. There is no frank motion of the distal femur or evidence of any instability along the course of the distal femur on palpation or manipulation.   IMAGING: Radiographs demonstrate evidence of what appears to be remote right supracondylar fracture with significant hypertrophic callus formation.   ASSESSMENT AND PLAN: This is a 64-year-old diabetic with 2 previous amputations, who is a nonambulator, with multiple other additional medical problems to include severe vascular heart disease and on chronic dialysis with what appears to be a remote healing supracondylar fracture. 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 There was some concern that this may also be indicative of chronic indolent osteomyelitis. While certainly further imaging studies would need to be performed to rule this out, I was present in the operating room during  debridement of her necrotizing fasciitis and the proximal wound that was debrided did not appear to track down towards her distal femur, so I think it  he is more likely this is just a remote fracture in a frail, nonambulating diabetic as opposed to an active infectious process. Would recommend nonoperative management for this fracture, as it clinically appears to be stable. She has no tenderness at the fracture site and she is a nonambulator. Would be unable to immobilize her with a knee immobilizer given her knee flexion contracture, so would just treat her symptomatically if she does complain of pain. If she continues to show a worsening clinical picture or further signs of infection after adequate treatment of her soft tissue infection, could further assess the distal femur with either an MRI or a tagged white cell scan.     ____________________________ Hani Patnode T. Chiquita Heckert, MD tte:jm D: 08/06/2012 15:58:03 ET T: 08/06/2012 16:26:39 ET JOB#: [id number]  cc: Arlo Butt T. Shaylynn Nulty, MD, <Dictator> Yosselin Zoeller T Sherece Gambrill MD ELECTRONICALLY SIGNED 08/06/2012 18:01 

## 2014-09-10 NOTE — Consult Note (Signed)
PATIENT NAME:  Anita Fleming, Anita Fleming MR#:  [id number] DATE OF BIRTH:  [birth year]  DATE OF CONSULTATION:  07/25/2012  CONSULTING PHYSICIAN:  Rosalyn GessMichael E. Blocker, MD  REFERRING PHYSICIAN:  Dr. Allena KatzPatel.   REASON FOR CONSULTATION:  Possible osteomyelitis.   HISTORY OF PRESENT ILLNESS:  The patient is a 64 year old female with a past history significant for end-stage renal disease on hemodialysis, diabetes, status post bilateral BKAs, methicillin sensitive Staphylococcus aureus bacteremia and recent Klebsiella bacteremia who was admitted on 07/25/2012 with hypoglycemia and who has developed a significant hip pain. The patient said that she was doing fairly well at home, but had noted some low blood sugar. She called EMS and she states that when EMS was transporting her, she developed significant pain in her hips bilaterally. She has a pressure ulcer on the right side and this continues to be uncomfortable. Per the H and P, her living conditions were extremely poor, and she had roaches on her including in the wound. She had apparently been confused as well. She denies any fevers, chills, or sweats recently. She had not been having pain in her hip prior to being transported by EMS. Her white count on admission was elevated and she was admitted to the CCU and started on pressors. She started on vancomycin and Zosyn. Currently her only complaint is the pain in her hips.   ALLERGIES:  CORTISONE, COZAAR AND ZOCOR.   PAST MEDICAL HISTORY:  1.  End-stage renal disease on hemodialysis.  2.  Diabetes.  3.  Coronary artery disease, status post non-ST elevation myocardial infarction in 12/2011.  4.  MSSA fistula infection, status post removal of the fistula in 01/2012, status post Klebsiella bacteremia in 05/2012 with retention of her hemodialysis catheter.  5.  Peripheral vascular disease, status post bilateral BKAs.  6.  Hypercholesterolemia.  7.  Possible DVT.  8.  The patient is blind.  9.  Status post cholecystectomy.   10.  Status post hysterectomy.   SOCIAL HISTORY:  The patient lives with her son. She gets around in a wheelchair. She is a prior smoker. She does not drink. She does not smoke.   FAMILY HISTORY:  Positive for diabetes and hypertension.   REVIEW OF SYSTEMS: GENERAL:  No fevers, chills, sweats, some malaise. No change in her appetite.  HEENT:  No headaches. No sinus congestion. No sore throat.  NECK:  No stiffness. No swollen glands.  RESPIRATORY:  No cough. No shortness of breath. No sputum production.  CARDIAC:  No chest pains or palpitations. No peripheral edema.  GASTROINTESTINAL:  She had some nausea this morning but no vomiting, no abdominal pain and no change in her bowels.  GENITOURINARY:  She does not make urine.  MUSCULOSKELETAL:  She has bilateral hip pain. She has an ulcer over the right hip.  SKIN:  She denies any rashes. She has some ulcers over the stumps from her prior BKAs but these are not bothering her, as well as the right hip ulcer, which was uncomfortable. She has had no redness or drainage from the stumps.  NEUROLOGICAL:  No focal weakness. She is legally blind.  PSYCHIATRIC:  No complaints. All other systems are negative.   PHYSICAL EXAMINATION:  VITAL SIGNS:  T-max of 98.2, T-current of 97.4, pulse 88, blood pressure 97/47 on pressors, 99% on room air.  GENERAL:  A 64 year old black female in no acute distress, moderately cachectic.  HEENT:  Normocephalic, atraumatic. Pupils equal and reactive to light. Extraocular motion appears to be  intact. Sclerae, conjunctivae, and lids are without evidence for emboli or petechiae. Oropharynx shows no erythema or exudate. Gums are in fair condition.  NECK:  Supple. Full range of motion. Midline trachea. No lymphadenopathy. No thyromegaly.  LUNGS:  Clear to auscultation bilaterally. Good air movement. No focal consolidation.  HEART:  Regular rate and rhythm without murmur, rub, or gallop.  ABDOMEN:  Soft, nontender, and  nondistended. No hepatosplenomegaly. No hernia is noted.  EXTREMITIES:  She is status post BKA bilaterally. There is no erythema or drainage from the BKA stumps. She has bilateral hip pain but no focal tenderness.  SKIN:  She has an ulcer on the right side, but she was lying on her right and this was not directly observed. There were no other rashes appreciated. There were no stigmata of endocarditis, specifically no Janeway lesions or Osler nodes.  NEUROLOGIC:  The patient is awake and interactive, moving all 4 extremities.  PSYCHIATRIC:  Mood and affect appeared normal.   LABORATORY DATA:  BUN of 26, creatinine 3.99, bicarbonate 24, anion gap of 10. LFTs were unremarkable. White count is 29.2 with a hemoglobin 9.4, platelet count of 199, ANC 28.1. On admission her white count was 27.2. She had white count on March 5th which was 13.1 and on March 1st which was 14.6. Blood cultures from admission show no growth. [name]. difficile PCR is negative. Blood cultures from March 1st are negative. A chest x-ray from admission showed nonspecific mild interstitial opacities and some small nodular opacities overlying the right upper lung. Right hip films showed no hip fracture. There was a small lucency overlying the proximal right thigh, which could be consistent with soft tissue gas. The left hip had no acute fracture. A CT scan of the femur and thigh on the right without contrast demonstrated soft tissue gas. It appears to be in the subcutaneous tissue or posterior lateral region. There was an abnormal appearance of the distal right femur with a subcondylar fracture. All these changes may represent chronic healing of incomplete fusion versus underlying osteomyelitis. A chest x-ray from March 5th showed no acute cardiopulmonary disease, and from March 1st showed no disease of the chest.   IMPRESSION:  This is a 64 year old female with a history of end-stage renal disease on hemodialysis, diabetes, status post bilateral  below-knee amputation, methicillin sensitive Staphylococcus aureus bacteremia and recent Klebsiella bacteremia who is admitted with right hip pressure ulcer and possible osteomyelitis.   RECOMMENDATIONS:  1.  She is currently on pressors, but I suspect that her blood pressure is always low, and this may represent her baseline. Her CT showed a femur fracture and gas in the wound. Unclear if this represents a fracture versus actually osteomyelitis.  2.  Blood and wound cultures are pending. A wound culture may less helpful as they are surface cultures.  3.  We will continue the vancomycin and Zosyn for now.  4.  Surgery is to see her to determine if incision and drainage is necessary. If so hopefully, we will get deep cultures. This is a  highly complex Infectious Disease case. Thank you very much for involving me in this patient's care.    ____________________________ Rosalyn Gess. Blocker, MD meb:jm D: 07/26/2012 11:57:52 ET T: 07/26/2012 12:44:47 ET JOB#: [id number]  cc: Rosalyn Gess. Blocker, MD, <Dictator> Domique Clapper E BLOCKER MD ELECTRONICALLY SIGNED 07/28/2012 8:34

## 2014-09-10 NOTE — Op Note (Signed)
PATIENT NAME:  Anita Fleming, Anita Fleming MR#:  [id number] DATE OF BIRTH:  10-10-1950  DATE OF PROCEDURE:  06/02/2012  PREOPERATIVE DIAGNOSES: 1. End-stage renal disease.  2. Infected left femoral PermCath.  3. 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  4. Peripheral vascular disease status post amputations.   POSTOPERATIVE DIAGNOSES:  1. End-stage renal disease.  2. Infected left femoral PermCath.  3. 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  4. Peripheral vascular disease status post amputations.  5. Occlusion of inferior vena cava.  PROCEDURES PERFORMED: 1. Ultrasound guidance for vascular access to right femoral vein.  2. Fluoroscopic guidance for placement of catheter.  3. Placement of catheter into inferior vena cava and right atrium from right femoral approach.  4. Inferior venacavogram.  5. Percutaneous transluminal angioplasty with 12 and 14 mm diameter angioplasty balloons to the inferior vena cava for occlusion.  6. Placement of 50 cm tip-to-cuff tunneled hemodialysis catheter, right femoral vein.  7. Removal of left femoral PermCath.   SURGEON: Annice NeedyJason S. Dew, M.D.   ANESTHESIA: Local with moderate conscious sedation.   ESTIMATED BLOOD LOSS: 50 mL.  CONTRAST USED: 10 mL Visipaque.   FLUOROSCOPY TIME: Approximately 5 minutes.   INDICATION FOR PROCEDURE: The patient is a 64 year old African American female with end-stage renal disease. She has been on dialysis for many years and has very limited dialysis access options. She already has multiple previous failed upper extremities and has superior vena cava occlusion syndrome and is now femoral catheter dependent. She has suspicion of infection of her left femoral head PermCath. We are asked to replace this at a new site. Due to her limited access options, there is possibility we would have to do wire exchange and this was discussed going in.   DESCRIPTION OF  PROCEDURE: The patient's groins were sterilely prepped and draped and a sterile surgical field was created. The right femoral vein itself was patent and accessed under direct ultrasound guidance without difficulty with a Seldinger needle and permanent image was recorded. A 5 French sheath was then placed for imaging. This demonstrated occlusion of the inferior vena cava, at the iliac vein confluence. It was unclear if there was much reconstitution distally. The existing left femoral PermCath was in the right atrium and was still functional. I then attempted to cross the lesion with an Amplatz superstiff wire and a Kumpe catheter. I was able to do so with surprising low difficulty. I was intraluminal in the atrium with injection through the Kumpe catheter. I then replaced the Amplatz super stiff wire. Initially, I attempted to take the PermCath over the wire, but this would not cross the lesion and so I had to balloon the entirety of the inferior vena cava with 12 mm diameter angioplasty balloon and the distal vena cava with a 14 mm diameter angioplasty balloon. At this point, after replacing a new peel-away sheath, I was able to get the 50 cm tip-to-cuff palindrome catheter to go over the wire and parked this in the right atrium. The peel-away sheath was then removed and the wire was removed. This involved a counter incision well down the thigh to make up some of the extra length for the long catheter. The cuff was near the catheter exit site, but still under after the skin, and the exit site was secured with 4-0 Monocryl pursestring suture. The access incision was closed with a 4-0 Monocryl and the catheter secured to the skin with 2 Prolene sutures.  It withdrew blood well and flushed easily with heparinized saline and concentrated heparin solution was then placed. I then removed the left femoral PermCath by anesthetizing that area copiously with 1% lidocaine and using hemostats to help dissect out the cuff and  then removed it in its entirety with gentle traction. Pressure was held over the site. Sterile dressing was placed. The patient tolerated the procedure well and was taken to the recovery room in stable condition.  ____________________________ Annice Needy, MD jsd:sb D: 06/02/2012 10:43:15 ET    T: 06/02/2012 11:49:21 ET        JOB#: [id number] cc: Annice Needy, MD, <Dictator> Annice Needy MD ELECTRONICALLY SIGNED 06/06/2012 10:30

## 2014-09-10 NOTE — Consult Note (Signed)
PATIENT NAME:  Anita Fleming, Anita Fleming MR#:  [id number] DATE OF BIRTH:  June 20, 1950  DATE OF CONSULTATION:  07/26/2012  CONSULTING PHYSICIAN:  Danelle Earthlyobin T. Eckel, MD  HISTORY OF PRESENT ILLNESS: The patient is a 64 year old female with multiple medical problems to include severe diabetes with end-stage renal dysfunction requiring chronic dialysis, as well as hypertension, peripheral vascular disease and coronary artery disease. She has also had 2 previous below-knee amputations and is a nonambulator at this time. The patient was admitted for significant right posterior thigh soft tissue infection. X-ray examination demonstrated what appeared to be a chronic right supracondylar femur fracture. Orthopedics was consulted.   PHYSICAL EXAMINATION: Upon examination, the patient has healed bilateral below-knee amputations with significant flexion contractures of both knees. She is nontender to palpation about her right distal femur. She does have a large soft tissue defect more proximally along her posterior thigh. There is no frank motion of the distal femur or evidence of any instability along the course of the distal femur on palpation or manipulation.   IMAGING: Radiographs demonstrate evidence of what appears to be remote right supracondylar fracture with significant hypertrophic callus formation.   ASSESSMENT AND PLAN: This is a 64 year old diabetic with 2 previous amputations, who is a nonambulator, with multiple other additional medical problems to include severe vascular heart disease and on chronic dialysis with what appears to be a remote healing supracondylar fracture. 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 There was some concern that this may also be indicative of chronic indolent osteomyelitis. While certainly further imaging studies would need to be performed to rule this out, I was present in the operating room during  debridement of her necrotizing fasciitis and the proximal wound that was debrided did not appear to track down towards her distal femur, so I think it  he is more likely this is just a remote fracture in a frail, nonambulating diabetic as opposed to an active infectious process. Would recommend nonoperative management for this fracture, as it clinically appears to be stable. She has no tenderness at the fracture site and she is a nonambulator. Would be unable to immobilize her with a knee immobilizer given her knee flexion contracture, so would just treat her symptomatically if she does complain of pain. If she continues to show a worsening clinical picture or further signs of infection after adequate treatment of her soft tissue infection, could further assess the distal femur with either an MRI or a tagged white cell scan.     ____________________________ Danelle Earthlyobin T. Eckel, MD tte:jm D: 08/06/2012 15:58:03 ET T: 08/06/2012 16:26:39 ET JOB#: [id number]  cc: Danelle Earthlyobin T. Eckel, MD, <Dictator> Danelle EarthlyBIN T ECKEL MD ELECTRONICALLY SIGNED 08/06/2012 18:01

## 2014-09-10 NOTE — Discharge Summary (Signed)
PATIENT NAME:  Anita Fleming, Anita Fleming MR#:  [id number] DATE OF BIRTH:  04/07/1951  DATE OF ADMISSION:  07/25/2012 DATE OF DISCHARGE:  Aug 09, 2012  ADMITTING DIAGNOSIS:  Hypoglycemia.  DISCHARGE DIAGNOSES:  1.  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 2.  Hypoglycemia due to sepsis as well as poor p.o. intake.  The patient continues to have intermittent hypoglycemia and needs close monitoring of her blood sugars. Her diabetic medications have been stopped. 3.  Positive blood culture for Staphylococcus epidermidis which is contaminant. 4.  Coronary artery disease status post stent. 5.  Peripheral vascular disease status post bilateral below-the-knee amputation. 6.  Adult failure to thrive with malnutrition status post evaluation by nutritionist. 7.  Distal right femur supracondylar fracture, appears to be likely old, status post evaluation by orthopedics. 8.  Anemia due to acute blood loss on top of chronic anemia, status post transfusion.  9.  Recent history of methicillin-sensitive Staphylococcus aureus Klebsiella bacteremia. 10.  History of multiple admissions for infected arteriovenous grafts in the past. 11.  Hyperlipidemia. 12.  Status post hysterectomy. 13.  Status post cholecystectomy. 14.  Status post thrombectomy. 15.  Status post multiple graft and fistula placement.  CONSULTANTS:  Ned Grace, MD - palliative care;  Orson Aloe, MD - infectious disease; Ida Rogue, MD - surgery; Mosetta Pigeon, MD - nephrology; Erin Sons, MD - orthopedics; Natale Lay, MD.  PROCEDURES:  On March 8th, done by Dr. Juliann Pulse, excision of necrotic soft tissue and fascia posterior thigh and then another debridement done on March 12th.  PERTINENT LABS AND EVALUATIONS:  Admitting glucose 87, BUN 20, creatinine 3.35, sodium 137, potassium 4.7, chloride 104, CO2 27. Hemoglobin A1c 4.8. WBC 27.2, hemoglobin 9.7, platelet  count 159.  Blood cultures initially, on March 7th, showed no growth. C. diff was negative.  Stool cultures no growth.  Giardia was negative.  EKG showed normal sinus rhythm.  Wound cultures showed heavy Enterococcus avium, heavy bacteroides  fragilis, Klebsiella.   HOSPITAL COURSE:  Please refer to H and P done by the admitting physician.  The patient is a 64 year old African American female who has a history of recurrent infections due to grafts and bacteremia in the past who was brought in from home due to very low blood sugar.  The patient is bedbound due to below-the-knee amputation and, when she initially came to the ED, she was noted to have an acute infection.  She does have a right thigh pressure ulcer which findings were consistent with possible necrotizing fascitis.  The patient was admitted. In order of her problems: 1.  For her septic shock, which was felt to be due to necrotizing fascitis, the patient was placed on broad-spectrum antibiotics.  An ID evaluation was done.  The patient's wound culture grew above findings.  She was placed on Cipro and Flagyl orally and she has a wound vac in place for the necrotizing fascitis. Has had 2 debridements.  She may need further debridements at [facility] depending on how her wound is doing. She has been afebrile and her blood pressure now has been stable. She did require some intermittent pressors during the hospitalization. 2.  Hypoglycemia.  Likely due to poor p.o. intake. The patient was on glipizide which has been discontinued. Since she is eating more her sugars are improved, but needs closer monitoring to make sure that they do not drop. 3.  End stage renal disease.  The patient will  continue to require dialysis. She will get dialyzed today and then she will follow the schedule at [facility].  4.  The patient also has very poor nutrition and was seen by nutrition team and was started on some supplemental dietary cans.   At this time, due to her complex  medical issues and requirement for further wound care debridement, the patient is being transferred to [facility].    DISCHARGE MEDICATIONS: 1.  Atropine diphenoxylate 1 tab p.o. q. 8 hours p.r.n. for diarrhea. 2.  Pentoxifylline 400 mg 1 tab p.o. t.i.d. 3.  Sensipar 60 mg 1 tab p.o. b.i.d.  4.  Vitamin D3 50,000 international units once a week. 5.  Midodrine 10 mg 1 tab on Tuesday, Thursday and Saturday. 6.  Flagyl 500 mg 1 tab p.o. q. 8 hours x 12 days. 7.  Tylenol 650 mg q. 4 hours p.r.n. for pain or temperature greater than 100.4. 8.  Acetaminophen/oxycodone 325/5 mg 1 tab p.o. q. 4 hours p.r.n. pain.  9.  Heparin 5000 sub-Q every 12 hours. 10.  Aspirin 81 mg 1 tab p.o. daily. 11.  Protonix 40 mg daily. 12.  Nepro Carb steady bolus can, 1 can t.i.d. with meals.  DIABETIC TREATMENT:  Check Accu-Cheks q. 4 hours until blood sugar is stable.  For hypoglycemia follow protocol at the facility.  DIET:  Renal diet.  Diet consistency soft, easy to chew.  ACTIVITY:  As tolerated.   DISCHARGE FOLLOWUP AND INSTRUCTIONS:  With primary MD once discharged from [facility].  Follow with Dr. Egbert GaribaldiBird as an outpatient once discharged from [facility].  At this point, she continues to have diarrhea.  Keep rectal tube in.  Wound vac care and hemodialysis per [facility].   TIME SPENT:  50 minutes.  ____________________________ Lacie ScottsShreyang H. Allena KatzPatel, MD shp:sb D: 03/18/13 10:42:45 ET T: 03/18/13 10:54:24 ET JOB#: [id number]  cc: Shreyang H. Allena KatzPatel, MD, <Dictator> Charise CarwinSHREYANG H PATEL MD ELECTRONICALLY SIGNED 08/04/2012 8:08

## 2014-09-10 NOTE — Consult Note (Signed)
PATIENT NAME:  Anita Fleming, Anita Fleming MR#:  [id number] DATE OF BIRTH:  19-Sep-1950  DATE OF CONSULTATION:  07/26/2012  CONSULTING PHYSICIAN:    HISTORY OF PRESENT ILLNESS: The patient is a 64 year old female with end-stage renal failure from chronic diabetes, on dialysis, with a history of bilateral below-knee amputations. She was admitted for a necrotizing soft tissue wound to her right thigh. The radiographic workup demonstrated what appeared to be a chronic distal femur fracture to the right lower extremity. Therefore, orthopedics was consulted to evaluate.   PAST MEDICAL HISTORY: Most significant for end-stage renal failure secondary to her diabetes. She also has hypertension, peripheral vascular disease, coronary artery disease and has partial blindness as well.   PHYSICAL EXAMINATION: The patient has bilateral below-knee amputations with bilateral knee flexion contractures of approximately 30 degrees. She has no tenderness to palpation about her distal femur or knee. She has a dressing over the wound to her right thigh.   RADIOGRAPHS: Reviewed. CT scan demonstrates distal femur fracture with significant callus formation. Bilateral hip x-rays demonstrate no other fractures.  ASSESSMENT AND PLAN: Nonambulatory bilateral lower extremity amputee with evidence of chronic healing distal femur fracture. Given that she is not symptomatic and is not weightbearing, we will manage her with comfort care for this healing fracture. We will also obtain more formal knee films to further delineate the fracture pattern. The patient is scheduled to go to the Operating Room for debridement of her soft tissue wound today, but I do not anticipate any orthopedic intervention for her healing fracture.    ____________________________ Alda BertholdHarold B. Kernodle Jr., MD hbk:jm D: 07/26/2012 13:47:00 ET T: 07/26/2012 14:16:46 ET JOB#: [id number]  cc: Alda BertholdHarold B. Kernodle Jr., MD, <Dictator> Alda BertholdHAROLD B KERNODLE, JR MD ELECTRONICALLY SIGNED  June 20, 2012 17:31

## 2014-09-10 NOTE — Consult Note (Signed)
Impression: 64yo Female w/ h/o ESRD on HD, DM, s/p bilat BKA, MSSA bacteremia, recent Klebsiella bacteremia admitted with right hip pressure ulcer with possible osteomyelitis.  She is currently on pressors, but I suspect that her BP is always low and this may represent her baseline.  Her CT showed a femur fracture and gas in the wound.  Unclear if this is osteomyelitis. Blood and wound cultures are pending.  Wound Cx may be less helpful as they are surface cultures. Will continue vanco and zosyn for now. Surgery to see her to determine if I&D is necessary.  If so, will hopefully get deep cultures.  Electronic Signatures: Blocker MPH, Rosalyn GessMichael E (MD)  (Signed on 08-Mar-14 11:47)  Authored  Last Updated: 08-Mar-14 11:47 by Blocker MPH, Rosalyn GessMichael E (MD)

## 2014-09-10 NOTE — Op Note (Signed)
PATIENT NAME:  Anita Fleming, Peta C MR#:  [id number] DATE OF BIRTH:  08/01/1950  DATE OF PROCEDURE:  07/26/2012  PREOPERATIVE DIAGNOSIS:  Necrotizing soft tissue infection, right lower extremity.  POSTOPERATIVE DIAGNOSIS:  Necrotizing soft tissue infection, right lower extremity.  PROCEDURE PERFORMED:  Excision of necrotic soft tissue and fascia, posterior right thigh.  Total dimensions 19 x 14 x 1 cm deep.   ESTIMATED BLOOD LOSS:  25 mL.   ANESTHESIA:  General.   COMPLICATIONS:  None.   SPECIMEN:  Culture swab for Gram stain culture and sensitivity.   HISTORY OF PRESENT ILLNESS:  The patient is a pleasant 64 year old female with history of multiple medical problems who presents with hypoglycemia and leukocytosis.  She was noted to have a significant ulcer over her leg and there is a large amount of necrotic soft tissue and purulence.  She was thus brought to the operating room for debridement of necrotizing soft tissue infection.   DETAILS OF PROCEDURE:  Informed consent was obtained.  The patient was brought to the operating room suite.  She was laid supine on the operating room table.  She was induced, LMA was placed, and general anesthesia was administered.  Her leg was prepped and draped in a standard surgical fashion.  A timeout was then performed correctly identifying patient name, operative site and procedure performed.  Necrotic soft tissue was incised.  There was a large amount of purulence and necrosis.  This was deep and on the fascia.  The fascia was also involved.  The fascia was excised.  I proceeded to debride all necrotic infected soft tissue to a total of 19 x 14 cm, 1 cm deep.  At this time I placed a Betadine soaked gauze in the wound and ABD pads and wrapped it with Kerlix.  The patient was then awoken, extubated and brought to postanesthesia care unit.  The needle, sponge and instrument counts were correct at the end of the procedure.     ____________________________ Si Raiderhristopher  A. Lundquist, MD cal:ea D: 07/26/2012 18:03:24 ET T: 07/27/2012 06:31:22 ET JOB#: [id number]  cc: Cristal Deerhristopher A. Lundquist, MD, <Dictator> Jarvis NewcomerHRISTOPHER A LUNDQUIST MD ELECTRONICALLY SIGNED 08/04/2012 19:49

## 2014-09-10 NOTE — Op Note (Signed)
PATIENT NAME:  Anita Fleming, Anita Fleming MR#:  [id number] DATE OF BIRTH:  06-08-1950  DATE OF PROCEDURE:  06/10/2012  PREOPERATIVE DIAGNOSES:  1.  Complication of dialysis catheter with exposure of the cuff.  2.  End-stage renal disease requiring hemodialysis.  3.  Superior vena cava syndrome.  4.  Inferior vena cava syndrome.   POSTOPERATIVE DIAGNOSES: 1.  Complication of dialysis catheter with exposure of the cuff.  2.  End-stage renal disease requiring hemodialysis.  3.  Superior vena cava syndrome.  4.  Inferior vena cava syndrome.   PROCEDURE PERFORMED: Exchange of cuffed tunneled dialysis catheter through same venous access.   SURGEON: Dr. Gilda Crease.   SEDATION: Versed 2 mg plus fentanyl 50 mcg administered IV. Continuous ECG, pulse oximetry and cardiopulmonary monitoring was performed throughout the entire procedure by the interventional radiology nurse. Total sedation time was 45 minutes.   ACCESS: Right common femoral vein.   FLUOROSCOPY TIME: 2.3 minutes.   CONTRAST USED: Isovue 5 mL.   INDICATIONS: The patient is a 64 year old woman with multiple medical problems. who presents with exposure of her cuffed tunneled catheter. She has extensive venous occlusions and obstructions both above and below the diaphragm and has been maintained on hemodialysis via a right common femoral approach. Risks and benefits for exchange were reviewed. All questions answered. The patient agrees to proceed.   DESCRIPTION OF PROCEDURE: The patient is taken to the special procedure suite, placed in the supine position. After adequate sedation is achieved, the right thigh and catheter are prepped and draped in a sterile fashion. The cuff is partially exposed and after 1% lidocaine is infiltrated, the remaining tissues are dissected free and the catheter is withdrawn approximately a centimeter and then transected. Amplatz Super Stiff wire is then advanced through the catheter up into the superior vena cava.   The  previously used catheter was somewhat long. It was a 55 cm tip to cuff and therefore a 42 cm tip to cuff, the next shortness size, was opened onto the field and advanced over the Amplatz Super Stiff wire without difficulty. Then, the wire was withdrawn and the catheter was aspirated. It would not aspirate. Contrast 5 mL was then injected via the catheter and it was noted to be in the hepatic portion with a stricture located at the arterial port. This  was obviously too small. After consideration, a cutdown was performed approximately 6 cm closer to the groin crease. The catheter was then removed from the wire leaving the wire and the wire was pulled into this new incision, thus advancing the whole catheter if you will approximately 6 cm closer to the groin and therefore closer to the heart. Having pulled the wire out of this, a Kumpe catheter was advanced over the wire. Glidewire was used for this portion and then the Amplatz Super Stiff wire was reintroduced. With the Amplatz in place, the 42 cm tip to cuff catheter was once again advanced over the wire and positioned so that the hub was at the level of this insertion site.   On fluoro, the tips were now within the atrium. The lumens both aspirated and flushed easily and were subsequently packed with 5000 units of heparin per lumen. Silk 0 was used to secure the hub to the skin of the thigh and a sterile dressing was applied. The patient tolerated the procedure well and there were no immediate complications.   ____________________________ Renford Dills, MD ggs:aw D: 06/10/2012 19:47:04 ET T: 06/11/2012 06:21:31 ET JOB#:  [id number]  cc: Renford DillsGregory G. Keimya Briddell, MD, <Dictator> Renford DillsGREGORY G Shakerria Parran MD ELECTRONICALLY SIGNED 06/13/2012 10:04

## 2014-09-10 NOTE — H&P (Signed)
PATIENT NAME:  Anita Fleming, Anita Fleming MR#:  [id number] DATE OF BIRTH:  Feb 02, 1951  DATE OF ADMISSION:  07/25/2012  ADMITTING PHYSICIAN: Enid Baas, MD  PRIMARY CARE PHYSICIAN: Tamika J. Lazarus Salines, MD  CHIEF COMPLAINT: Hypoglycemia.   HISTORY OF PRESENT ILLNESS: The patient is a 64 year old African American female who is very disheveled in appearance with past medical history significant for end-stage renal disease on Tuesday, Thursday, Saturday hemodialysis, coronary artery disease, status post stents, prior admissions in the past for MSSA bacteremia, Klebsiella bacteremia, history of peripheral vascular disease, status post bilateral below-knee amputations, brought in from home by EMS secondary to low blood sugars. The patient lives at home with her son and apparently she is bedbound  due to her below-knee amputation and she was very disheveled appearance with cockroaches crawling over her blankets when she came in. She has a right thigh pressure ulcer, which is about 7 x 5 cm with ragged edges, about half-inch depth, 2-3 corners.  No active secretions, but there was no dressing cover and there were bugs crawling out of the wound as well.  She does have a superficial ulcer at the stump on her left leg, which was very well covered.  She also had a large loose bowel movement, greenish and grainy and liquidy while in the ER repeat. She denies any fevers or chills.  She was confused apparently, according to son, who called EMS. Also there was a power outage at home. When EMS came and checked her vitals, her sugar was found to be in the 30s, so an amp of D50 and sugar improved to 154 and brought her to the hospital. Her white count was found to be elevated at 27,000 and then she is being admitted for further management of the same.   PAST MEDICAL HISTORY: 1.  End-stage renal disease on Tuesday, Thursday, Saturday hemodialysis.  2.  Coronary artery disease status post MI and status post stents in the past.  3.   Multiple admissions for infected AV graft.  4.  Peripheral vascular disease status post bilateral below-knee amputation.  5.  Non-insulin-dependent diabetes mellitus.  6.  Hyperlipidemia.   PAST SURGICAL HISTORY: 1.  Hysterectomy.  2.  Cholecystectomy.  3.  Thrombectomy.  4.  Bilateral below-knee amputations.  5.  Multiple graft and fistula placement.   ALLERGIES TO MEDICATION:  INTOLERANT TO  CORTISONE, COZAAR AND ZOCOR.   CURRENT HOME MEDICATIONS:  1.  Atorvastatin 10 mg p.o. daily.  2.  Atropine/diphenoxylate 0.02/2.5 mg 1 tablet every 8 hours p.r.n. for diarrhea.  3.  Glipizide 5 mg p.o. daily.  4.  Midodrine 10 mg p.o. daily on Tuesday, Thursday, Saturday.  5.  Pentoxifylline 400 mg extended-release 3 times a day. 6.  Sensipar 60 mg b.i.d.  7.  Vitamin D2 2,000 international units 1 capsule once a week.  SOCIAL HISTORY:  Lives at home with her son, no smoking, alcohol or drug abuse.     FAMILY HISTORY: Mother and brothers with diabetes and a sister with hypertension.   REVIEW OF SYSTEMS: CONSTITUTIONAL:  No fever. No fatigue or weakness.  EYES: Legally blind. The left eye with partial vision. No glaucoma or cataracts.  ENT:  No tinnitus, ear pain hearing loss, epistaxis or discharge.  RESPIRATORY: No cough, wheeze, hemoptysis or COPD.   CARDIOVASCULAR: No chest pain, orthopnea, edema, arrhythmia, palpitations or syncope.  GASTROINTESTINAL: No nausea and vomiting. Positive for diarrhea. No abdominal pain, hematemesis or melena.  GENITOURINARY: No dysuria. The patient is anuric.  ENDOCRINE: No polyuria, nocturia, thyroid problems, heat or cold intolerance.  HEMATOLOGY: No anemia, easy bruising or bleeding.  SKIN: No acne, rash or lesions.  MUSCULOSKELETAL: Positive for bilateral hip pain. No arthritis or gout.  NEUROLOGIC: No numbness, weakness, CVA, TIA or seizures.  PSYCHOLOGICAL: No anxiety, insomnia, depression.   PHYSICAL EXAMINATION:  VITAL SIGNS: Temperature  98.4 degrees Fahrenheit, pulse 91, respirations 20, blood pressure 114/60, pulse oximetry 100% on room air.  GENERAL:  Ill-built, ill-nourished female lying in bed,  mildly distress secondary to pain in the hip region.  HEENT: Normocephalic, atraumatic. Pupils equal, round, reacting to light. Anicteric sclerae. Extraocular movements intact. Muddy conjunctivae. Oropharynx clear without erythema, mass or exudates.  NECK: Supple. No thyromegaly, JVD or carotid bruits. No lymphadenopathy.  LUNGS: Clear to auscultation bilaterally. Decreased bibasilar breath sounds. No wheeze or crackles. No use of accessory muscles for breathing.  CARDIOVASCULAR: S1, S2 regular rate and rhythm. 3/6 systolic murmur present. No rubs or gallops and there are dilated veins seen on the chest wall. Normal bowel sounds.  EXTREMITIES: Status post bilateral below-knee amputation. There is a stump wound on the left side which is well covered with Tegaderm and there is a large open wound with ragged edges and infected appearing, 7 x 5 cm ulcer on the posterior side of the right high, which is covered now.   MUSCULOSKELETAL: Tenderness and small bruising on both hips, more prominent on the right hip.  SKIN: No acne, rash or lesions.  LYMPHATICS: No cervical lymphadenopathy.  NEUROLOGIC: Cranial nerves intact. No focal or sensory deficits.  PSYCHOLOGIC: The patient is awake, alert oriented x 3.   LABORATORY DATA: WBC is a 27.2, hemoglobin 9.7, hematocrit 34.3, platelet count 159.   Sodium 137, potassium 4.7, chloride 27, chloride 104, bicarbonate 27, BUN 20, creatinine 3.35,  glucose 87, calcium is 8.8.   Chest x-ray is showing nonspecific mild interstitial opacities, small nodular opacity overlying the right upper lung. Atypical infection should be considered.   ASSESSMENT AND PLAN: A 64 year old female with past medical history of end-stage renal disease on dialysis,  coronary artery disease status post stents and peripheral  vascular disease status post bilateral below-knee amputation. Admitted for hypoglycemia and leukocytosis.  1.  Systemic inflammatory response syndrome. We will admit, blood cultures be drawn, especially as she has PermCath. She does have prior history of methicillin-sensitive staphylococcus bacteremia and Klebsiella bacteremia and PermCath infection. Also, she does have an opened decubitus ulcer on her right thigh posteriorly, which could be the source of infection as well, so also wound cultures have been drawn. She had one loose bowel movement in the ED and so we will stool for Clostridium difficile. Empirically start on vancomycin and Zosyn at this time.  We will get infectious disease consult.  2.  Hypoglycemia likely worsened from her infection.  We will hold glipizide only cover with sliding scale insulin for now. 3.  End-stage renal disease on Tuesday, Thursday, Saturday hemodialysis. Last dialysis was done yesterday. She did not appear to be in fluid overload. Nephrology will be consulted for dialysis tomorrow.  4.  Coronary artery disease, status post stent, appears to be stable. Continue aspirin and statin.  5.  Peripheral vascular disease status post bilateral below-knee amputation. Continue her home medications.  6.  Gastrointestinal and deep vein thrombosis prophylaxis. On Protonix and subcutaneous heparin.    CODE STATUS: Full code.   TIME SPENT ON ADMISSION: Is 50 minutes.     ____________________________ Enid Baas, MD rk:cc  D: 07/25/2012 14:21:55 ET T: 07/25/2012 17:30:52 ET JOB#: [id number]  cc: Enid Baasadhika Kalisetti, MD, <Dictator> Tamika J. Lazarus SalinesLott, MD Enid BaasADHIKA KALISETTI MD ELECTRONICALLY SIGNED 08/02/2012 13:20

## 2014-09-10 NOTE — Consult Note (Signed)
General Aspect lack of appropriate IV access   Present Illness The patient is a 64 year old female with a past history significant for end-stage renal disease on hemodialysis, diabetes, status post remote bilateral BKAs, methicillin sensitive Staphylococcus aureus bacteremia and recent Klebsiella bacteremia who was admitted on 07/25/2012 with hypoglycemia and who has developed a significant hip pain. She has a pressure ulcer on the right side. Per the H and P, her living conditions were extremely poor, and she had roaches contaminating the wound. Her white count on admission was elevated and she was admitted to the CCU and started on pressors. She started on vancomycin and Zosyn. Currently her only complaint is the pain in her hips.  Given the need for multiple life sustaining medications including pressors and antibiotics her IV access has been quite difficult.  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   ALLERGIES:  CORTISONE, COZAAR AND ZOCOR.   PAST MEDICAL HISTORY:  1.  End-stage renal disease on hemodialysis.  2.  Diabetes.  3.  Coronary artery disease, status post non-ST elevation myocardial infarction in 12/2011.  4.  MSSA fistula infection, status post removal of the fistula in 01/2012, status post Klebsiella bacteremia in 05/2012 with retention of her hemodialysis catheter.  5.  Peripheral vascular disease, status post bilateral BKAs.  6.  Hypercholesterolemia.  7.  Possible DVT.  8.  The patient is blind.  9.  Status post cholecystectomy.  10.  Status post hysterectomy.   Home Medications: Medication Instructions Status  atorvastatin 10 mg oral tablet 1 tab(s) orally once a day (at bedtime) Active  atropine-diphenoxylate 0.025 mg-2.5 mg oral tablet 1 tab(s) orally every 8 hours, As Needed- for Diarrhea  Active  pentoxifylline 400 mg oral tablet, extended release 1 tab(s) orally 3 times a day Active   glipiZIDE 5 mg oral tablet 1 tab(s) orally once a day Active  Sensipar 60 mg oral tablet 1 tab(s) orally 2 times a day Active  Vitamin D3 50,000 intl units oral capsule 1 cap(s) orally once a week Active  midodrine 10 mg oral tablet 1 tab(s) orally once a day on Tuesday, Thursday, and Saturday Active    Zocor: Other  Cozaar: Unknown  Cortisone: Unknown  Case History:  Family History Non-Contributory   Social History negative tobacco, negative ETOH, negative Illicit drugs   Review of Systems:  Fever/Chills No   Cough No   Sputum No   Abdominal Pain No  hip pain   Diarrhea No   Constipation No   Nausea/Vomiting No   SOB/DOE No   Chest Pain No   Physical Exam:  GEN well developed, critically ill appearing   HEENT hearing intact to voice, dry oral mucosa, poor dentition   NECK supple  trachea midline   RESP normal resp effort  no use of accessory muscles   CARD regular rate  No LE edema   ABD denies tenderness  soft   EXTR negative cyanosis/clubbing, negative edema, left groin has a compressible femoral vein by ultrasound   SKIN No rashes, positive ulcers, skin turgor poor   NEURO follows commands, patient is fluent   PSYCH alert, A+O to time, place, person   Nursing/Ancillary Notes: **Vital Signs.:   09-Mar-14 09:00  Vital Signs Type Routine  Pulse Pulse 98  Pulse source if not from Vital Sign Device per cardiac monitor  Respirations Respirations 21  Systolic BP Systolic BP 68  Diastolic  BP (mmHg) Diastolic BP (mmHg) 33  Mean BP 44  BP Source  if not from Vital Sign Device non-invasive  Pulse Ox Activity Level  At rest  Oxygen Delivery Room Air/ 21 %   Routine Chem:  09-Mar-14 07:00   Glucose, Serum  192  BUN  32  Creatinine (comp)  4.44  Sodium, Serum 138  Potassium, Serum 3.9  Chloride, Serum 105  CO2, Serum  16  Calcium (Total), Serum 9.1  Anion Gap  17  Osmolality (calc) 288  eGFR (African American)  11  eGFR (Non-African  American)  10 (eGFR values <64m/min/1.73 m2 may be an indication of chronic kidney disease (CKD). Calculated eGFR is useful in patients with stable renal function. The eGFR calculation will not be reliable in acutely ill patients when serum creatinine is changing rapidly. It is not useful in  patients on dialysis. The eGFR calculation may not be applicable to patients at the low and high extremes of body sizes, pregnant women, and vegetarians.)  Result Comment HGB/HCT - RESULTS VERIFIED BY REPEAT TESTING.  Result(s) reported on 27 Jul 2012 at 07:53AM.  Routine Hem:  09-Mar-14 07:00   WBC (CBC)  35.6  RBC (CBC) 3.89  Hemoglobin (CBC)  9.1  Hematocrit (CBC)  31.3  Platelet Count (CBC) 272  MCV 81  MCH  23.4  MCHC  29.1  RDW  23.4  Neutrophil % 95.1  Lymphocyte % 2.4  Monocyte % 2.1  Eosinophil % 0.2  Basophil % 0.2  Neutrophil #  33.9  Lymphocyte #  0.8  Monocyte # 0.7  Eosinophil # 0.1  Basophil # 0.1    Impression 1.  Lack of appropriate IV access          will place femoral line with ultrasound 2.  fascitis          plan per general surgery and ID 3.  end stage renal disease          continue femoral permacath for now options are very limitted   Plan level 3   Electronic Signatures: SHortencia Pilar(MD)  (Signed 09-Mar-14 12:16)  Authored: General Aspect/Present Illness, Home Medications, Allergies, History and Physical Exam, Vital Signs, Labs, Impression/Plan   Last Updated: 09-Mar-14 12:16 by SHortencia Pilar(MD)

## 2014-09-10 NOTE — H&P (Signed)
   Subjective/Chief Complaint Right posterior thigh subcutaneous emphysema/ulcer   History of Present Illness [name] is a pleasant 64 yo AAF who was admitted on 3/7 for low blood sugars.  She is poorly communicative.  She is a vasculopath with multiple medical issues.  She was found to be very disheveled with cockroaches over her blankets when she came in.  She complained of right thigh pain.  WBC 27K, on low dose pressors.  CT shows subcutaneous emphysema on posterior thigh.  Otherwise no fevers, chills, night sweats, shortness of breath, cough, chest pain, abdominal pain, nausea/vomiting/diarrhea/constipation/dysuria/hematuria.   Past History ESRD on hemodialysis CAD s/p MI/stents H/o multiple infected AV graft PVD s/p bilateral BKA DM Hyperlipidemia Hysterectomy h/o cholecystectomy h/o thrombectomy h/o bilateral BKA   Past Medical Health Coronary Artery Disease, Diabetes Mellitus   Past Med/Surgical Hx:  Dialysis:   esrd:   HTN:   PVD - Peripheral Vascular Disease:   CAD: coronary stent  Partially Blind:   Diabetes:   Renal Failure:   Dialysis three times per week:   Gall Bladder Removed:   Hysterectomy:   Bilat BKA:   ALLERGIES:  Zocor: Other  Cozaar: Unknown  Cortisone: Unknown  Family and Social History:  Family History Hypertension  Diabetes Mellitus   Social History negative tobacco, negative ETOH, negative Illicit drugs   Place of Living At home with son   Review of Systems:  Subjective/Chief Complaint Right hip pain   Fever/Chills Yes  No   Cough No   Sputum No   Abdominal Pain No   Diarrhea Yes   Constipation No   Nausea/Vomiting No   SOB/DOE No   Chest Pain No   Dysuria No   Tolerating Diet Yes   Physical Exam:  GEN cachectic, thin, disheveled, critically ill appearing   HEENT pink conjunctivae, PERRL   NECK No masses  thyroid not tender   RESP normal resp effort  clear BS  no use of accessory muscles   CARD regular rate  no  murmur  no thrills  No LE edema  no Rub   VASCULAR ACCESS Dialysis catheter present   ABD denies tenderness  no hernia  soft  normal BS   LYMPH negative neck, negative axillae   EXTR negative edema, + large posterior right eschar with fluctuance   SKIN normal to palpation, positive ulcers   NEURO cranial nerves intact, negative rigidity, negative tremor   PSYCH A+O to time, place, person, good insight    Assessment/Admission Diagnosis [name] is a pleasant 64 yo F with multiple medical problems with subcutaneous emphysema right posterior thigh.  Leukocytosis.  Hypotension.   Plan To OR for operative debridement of right thigh necrotizing soft tissue infection.   Electronic Signatures: Jarvis NewcomerLundquist, Adryan Shin A (MD)  (Signed 08-Mar-14 12:29)  Authored: CHIEF COMPLAINT and HISTORY, PAST MEDICAL/SURGIAL HISTORY, ALLERGIES, FAMILY AND SOCIAL HISTORY, REVIEW OF SYSTEMS, PHYSICAL EXAM, ASSESSMENT AND PLAN   Last Updated: 08-Mar-14 12:29 by Jarvis NewcomerLundquist, Doriann Zuch A (MD)

## 2014-09-12 NOTE — Op Note (Signed)
PATIENT NAME:  Anita Fleming, Anita Fleming MR#:  [id number] DATE OF BIRTH:  [birth year]  DATE OF PROCEDURE:  06/15/2011  PREOPERATIVE DIAGNOSES:  1. Complication AV dialysis device with decreasing efficiency of dialysis and difficulty with cannulation.  2. Endstage renal disease requiring hemodialysis.   POSTOPERATIVE DIAGNOSES:  1. Complication AV dialysis device with decreasing efficiency of dialysis and difficulty with cannulation.  2. Endstage renal disease requiring hemodialysis.   PROCEDURES PERFORMED:  1. Left arm brachial axillary dialysis graft angiography.  2. Percutaneous transluminal angioplasty venous outflow left arm brachial axillary dialysis graft.  3. Percutaneous transluminal angioplasty of left innominate vein.   SURGEON: Levora DredgeGregory Schnier, M.D.   SEDATION: Versed 3 mg plus fentanyl 150 mcg administered IV. Continuous ECG, pulse oximetry, and cardiopulmonary monitoring was performed throughout the entire procedure by the interventional radiology nurse. Total sedation time was one hour.   ACCESS: 6 French sheath, antegrade direction, left arm brachial axillary dialysis graft.   CONTRAST USED: Isovue 35 mL.   FLUORO TIME: 3 minutes.   INDICATIONS: Anita Fleming is a 64 year old woman maintained on hemodialysis who has been having increasing difficulties with efficiency as well as cannulation. The risks and benefits for contrast injection and treatment of her AV graft were reviewed. All questions are answered. The patient agrees to proceed.   DESCRIPTION OF PROCEDURE: The patient is taken to Special Procedures and placed in the supine position. Her left arm is extended palm upward. The left arm is prepped and draped in a sterile fashion. 1% lidocaine is infiltrated into the soft tissues overlying the graft near the arterial anastomosis and access to the graft is obtained with a micropuncture needle, microwire followed by micro sheath, J-wire followed by 6 French sheath. Hand injection of contrast  is then utilized to demonstrate the graft as well as the central venous anatomy. Tandem strictures are noted, one within the venous puncture site and one in the previously stented innominate. 4000 units of heparin were given and allowed to circulate.   A Magic torque wire and KMP catheter were then used to negotiate the graft as well as the central venous anatomy and the catheter and wire combination were advanced into the right atrium where hand injection of contrast was utilized to confirm intraluminal placement. The wire was then negotiated into the inferior vena cava. An 8 x 4 conquest balloon was then advanced across the lesion within the venous portion of the graft and inflated to 16 to 18 atmospheres for one minute. Follow-up angiography demonstrated an excellent result with less than 5% residual stenosis.   A 10 x 4 conquest balloon and subsequently a 12 x 4 conquest balloon was then advanced across the innominate lesion. Serial inflations were made over a distance of approximately 12 cm. Follow-up imaging after the 12-mm diameter inflation demonstrated an excellent result with significant improvement in the rapidity of the contrast flowing through the central veins. Pursestring suture of 4-0 Monocryl was placed around the 7 [place] sheath. In order to utilize the 12-mm diameter balloon, the sheath was up-sized to a 7 French sheath and the sheath was removed. A pursestring suture was secured and light pressure was held and there were no immediate complications.   INTERPRETATION: Images of the AV graft of the left arm demonstrate a high-grade stenosis in the venous portion. This is well treated with the conquest balloon to an 8 mm diameter  inflation. Images demonstrate the axillary and subclavian veins are widely patent. The innominate vein is previously stented. It  is occluded initially. Following negotiating the wire across this lesion and then angioplasty first with a 10-mm and then a 12-mm balloon,  there is now patency of the vein with rapid flow of contrast.        SUMMARY: Successful angioplasty of the central veins as well as angioplasty of the venous portion of the graft as described above.     ____________________________ Renford Dills, MD ggs:bjt D: 06/16/2011 14:28:22 ET T: 06/16/2011 14:50:16 ET JOB#: [id number]  cc: Renford Dills, MD, <Dictator> Mickel Crow, MD Munsoor Lizabeth Leyden, MD Renford Dills MD ELECTRONICALLY SIGNED 07/04/2011 11:09

## 2014-09-12 NOTE — Op Note (Signed)
PATIENT NAME:  Anita Fleming, PAT MR#:  [id number] DATE OF BIRTH:  [birth year]  DATE OF PROCEDURE:  08/25/2011  PREOPERATIVE DIAGNOSES:  1. Thrombosis of left arm brachial axillary dialysis graft.  2. End-stage renal disease requiring hemodialysis.  3. 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 Thrombosis of left arm brachial axillary dialysis graft.  2. End-stage renal disease requiring hemodialysis.  3. 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 Contrast injection left arm brachial axillary dialysis graft.  2. Mechanical thrombectomy left arm brachial axillary dialysis graft.  3. Percutaneous transluminal angioplasty of the superior vena cava and innominate vein in the previously placed stent.  4. Stent placement for failed angioplasty left superior vena cava innominate.  5. Percutaneous transluminal angioplasty of the left subclavian vein.  6. Intravascular stent placement left subclavian vein for failed angioplasty.  7. Percutaneous transluminal angioplasty of the venous cannulation site and venous outflow tract.  8. Percutaneous transluminal angioplasty of the arterial anastomosis.   SURGEON: Renford Dills, MD.   SEDATION: Versed 3 mg plus fentanyl 75 mcg administered IV. Continuous ECG, pulse oximetry and cardiopulmonary monitoring is performed throughout the entire procedure by the interventional radiology nurse. Total sedation time was two hours.   ACCESS:  1. 7 French sheath, antegrade direction, left arm brachial axillary dialysis graft.  2. 6 French sheath, retrograde direction, left arm brachial axillary dialysis graft.   CONTRAST USED: Isovue 75 mL.  FLUORO TIME: Approximately 10 minutes.   INDICATIONS: Ms. Meneely presented to dialysis with thrombosis of her AV graft. She is referred for treatment.   DESCRIPTION OF  PROCEDURE: The patient is taken to the special procedure suite, placed in the supine position. After adequate sedation is achieved, left arm is extended palm upward and prepped and draped in a sterile fashion. 1% lidocaine is infiltrated in the soft tissues overlying the easily palpable AV graft and a micropuncture needle is inserted in an antegrade direction near the arterial anastomosis. Microwire followed by micro sheath, J-wire followed by a 6 French sheath. KMP catheter and Glidewire are then negotiated into the central venous anatomy where hand injection of contrast is used to demonstrate the central venous portions. These are not thrombosed, but there do appear to be several areas that have a high-grade strictures and stenoses. Given the success in the past with the patient maintaining her central venous stents, 4,000 units of heparin is given. The KMP catheter is removed and a Trerotola device is opened onto the field. Trerotola device is then utilized to perform mechanical thrombectomy of the venous portion of the graft. Several passes are made. Follow-up angiography demonstrates significant improvement.   1% lidocaine is infiltrated in the soft tissues overlying the palpable graft near the level of the deltoid and in a retrograde direction, microwire followed by micro sheath, J-wire followed by a 6 French sheath is inserted. Trerotola device is then introduced and advanced into the brachial artery. It is opened, but not engaged and then pulled back into the graft where the basket is then engaged. Several passes are made. Forward flow is now established into the graft, however, on central venous imaging, there is clearly lack of adequate flow of contrast. Contrast is hanging up in the subclavian vein.   Magic torque wire used utilizing KMP catheter is negotiated into the inferior vena cava and initially  an 8 x 4 conquest balloon but ultimately a 10 x 4 balloon is utilized to angioplasty the superior  vena cava and the innominate. Following the 10 mm inflation, there is still a problem. Also, on magnified images with the KMP catheter in the distal subclavian vein, there is clearly a new previously not demonstrated lesion in the mid subclavian at the level which the subclavian passes under the clavicle. This, too, is angioplastied both with the 8 and the 10 mm balloons. Again, follow-up angiography through the KMP catheter demonstrates both these lesions remain greater than 50% and contrast is hanging up in the distal subclavian and axillary with lack of forward flow.   Prior to treating the central venous, there was a lesion noted in the graft at the anastomotic area of the venous anastomosis and is in the area of venous cannulation. These two were treated with the 8 mm conquest balloon at approximately a 20 to 24 atmosphere inflation for a minute with excellent result. These two areas are resolved without further treatment.   Life Star stents, a 14 x 6 and subsequently, a 14 x 3 are used to cover the two areas, the subclavian innominate lesion and also the subclavian lesion. These two are stented separately and then post dilated using a 12 x 4 conquest balloon. Follow-up angiography now demonstrates the contrast does flow quite readily through the system and is no longer pooling in the axillary system.   Forward flow, nevertheless, seems to be somewhat diminished and with the 8 x 4 balloon inflated in the venous portion, reflux imaging demonstrates a high-grade stricture at the puncture site of the 7 French sheath. It should be noted we upsized to a 7 French sheath in order to allow for the 10 and 12 conquest balloons. Therefore, Magic torque wire is introduced through the retrograde sheath out into the brachial artery and a 6 x 4 conquest balloon is advanced across this lesion in the arterial extending into the arterial anastomosis. The 6 mm balloon is inflated to 18 atmospheres for one minute. Follow-up  angiography demonstrates resolution of this narrowing at the level of the anastomosis with rapid flow of contrast now through the graft. There appears to be, based on these images, suitable flow of contrast. The venous portion of the graft was once again checked and again rapid flow of contrast is now witnessed which should allow for patency of her graft.   4-0 Monocryl pursestring sutures were placed around both sheaths. There were removed and there are no immediate complications.   INTERPRETATION: Initial images demonstrate that the graft is thrombosed. The central stents also demonstrate narrowing and there appears to be a new lesion in the mid subclavian. Following mechanical thrombectomy, there are two lesions uncovered within the venous portion of the graft itself. Subsequently later in the case as described above, there is also an arterial lesion noted at the level of the anastomosis. Following angioplasty to 10 mm, the central lesions are inadequate and, therefore, 14 mm self-expanding Life Star stent are placed and post dilated to 12 mm with excellent result. 8 mm inflations are used within the graft's venous portion and a 6 mm balloon is used to dilate the arterial end. Following these maneuvers, of course after successful mechanical thrombectomy, there appears to be patency of the graft with brisk flow of contrast within the venous system and should allow for patency.     SUMMARY: Successful thrombectomy with treatment of multiple venous lesions as well as  an arterial lesion as described above.    ____________________________ Renford DillsGregory G. Decari Duggar, MD ggs:ap D: 08/25/2011 12:51:28 ET             T: 08/25/2011 13:20:04 ET                  JOB#: [id number]  cc: Renford DillsGregory G. Gabrian Hoque, MD, <Dictator> Renford DillsGREGORY G Amelda Hapke MD ELECTRONICALLY SIGNED 08/29/2011 14:08

## 2014-09-12 NOTE — Consult Note (Signed)
PATIENT NAME:  Anita Fleming, Anita Fleming MR#:  [id number] DATE OF BIRTH:  [birth year]  DATE OF CONSULTATION:  08/20/2011  REFERRING PHYSICIAN:  Myra Rudehristopher Smith, MD CONSULTING PHYSICIAN:  Krystal EatonShayiq Plummer Matich, MD  REASON FOR CONSULTATION: Medical management.  CHIEF COMPLAINT: Right knee pain.  HISTORY OF PRESENT ILLNESS: The patient is a 64 year old African American female with history of peripheral vascular disease status post bilateral below-knee amputation, end-stage renal disease on dialysis Tuesdays, Thursdays, and Saturdays, legal blindness, diabetes, and hypertension who presented to the ER after developing right knee pain yesterday. The patient stated that she was in her electrical wheelchair and as she bent over to reach for the kitchen door her chest area hit against the controls and the wheelchair jerked forward trapping her knee between the wheelchair and a storage device. She developed right-sided knee pain. EMS was called and here she was found to have a distal tibial fracture, minimally displaced.  She was admitted to orthopedics for pain management and per Dr. Katrinka BlazingSmith no surgery is planned. Medicine is consulted for medical management. The patient denies having any chest pain or shortness of breath. The patient is in pain in her right knee area. She denies having abdominal pain. She has chronic blurry vision.   For diabetes the patient states that she is unsure what type of insulin she has, but her diabetes is fairly well controlled and she is on 4 units in the morning and 3 units in the afternoon. Otherwise she has chronic diarrhea that she is on Lomotil for.   PAST MEDICAL HISTORY:  1. End-stage renal disease on dialysis Tuesdays, Thursdays, and Saturdays. 2. Legal blindness. 3. Insulin-dependent diabetes mellitus. 4. History of thrombosis, currently off of Coumadin. 5. History of shunt infections in the past. 6. History of peripheral vascular disease, status post bilateral below-knee amputation.    PAST SURGICAL HISTORY:  1. Cholecystectomy. 2. Thrombectomy. 3. Hysterectomy.  4. Below-knee amputation. 5. History of previous graft placements for dialysis access.   MEDICATIONS: It is unclear what medicines she is on, however, based on the medicine record done in the ER, she is at least on the following. 1. Lomotil 2.5/0.025 mg every eight hours as needed for loose stools.  2. Oxycodone 5 mg every six hours as needed for pain. 3. Renvela 800 mg 4 tabs with meals and 2 tabs with snacks. 4. The patient claims she is on insulin, 4 units in the morning and 3 units in the afternoon.  5. She previously was on Lopressor 50 mg twice a day and Zantac 150 mg.   ALLERGIES: Cozaar and Zocor.   SOCIAL HISTORY: She lives in her place and her son lives with her. She is wheelchair bound. Previous smoker. No alcohol or drug use.   FAMILY HISTORY: Six sisters and two brothers with diabetes and family history of high blood pressure.  REVIEW OF SYSTEMS: CONSTITUTIONAL: Denies fevers. Has generalized weakness. No weight changes. EYES: Has legal blindness and chronic blurry vision. ENT: No tinnitus, snoring, sore throat, or difficulty swallowing. RESPIRATORY: Denies cough, wheezing, or shortness of breath. CARDIOVASCULAR: Denies chest pain, dyspnea on exertion, or palpitations. GI: Has chronic diarrhea. No abdominal pain. No nausea or vomiting. No black tarry stool history.  GENITOURINARY: She is anuric. ENDOCRINE: Denies polyuria, nocturia, or thyroid problems. HEME/LYMPH: Has a history of chronic anemia. MUSCULOSKELETAL: Has right knee pain currently. NEUROLOGIC: Denies generalized weakness or numbness. Possible history of transient ischemic attack in the past. PSYCHIATRIC: Denies anxiety or insomnia.   PHYSICAL EXAMINATION:  VITAL SIGNS: Temperature 97.8, pulse 89, respiratory rate 18, blood pressure 119/69, oxygen saturation 99% on room air.   GENERAL: The patient is an elderly African American  female lying in bed in mild distress.   HEENT: Normocephalic, atraumatic. Pupils are equal and reactive. Anicteric sclerae. Moist mucous membranes. Poor dentition.   NECK: Supple. No thyroid tenderness.   HEART: S1 and S2 regular rate and rhythm. No murmurs, rubs, or gallops appreciated.   LUNGS: Clear to auscultation without crackles or wheezing.   ABDOMEN: Soft, nontender, and nondistended. Positive bowel sounds in all quadrants.  EXTREMITIES: Bilateral below-knee amputation. I did not examine strength or range of motion in the lower extremities secondary to induction of significant pain with minimal movement. The patient has a good bruit in the right upper extremity graft dialysis access with multiple healed surgical scars in the extremities as well.   NEURO: Strength is 5/5 in the upper extremities. Cranial nerves II through XII are grossly intact otherwise.   LABS/STUDIES: Glucose 182, BUN 33, potassium 4.2, creatinine 5.48, sodium 138. LFTs are essentially within normal limits. WBC 5.5, hemoglobin 9.4, hematocrit 30.4, and platelets 115.   X-ray of the right knee as above.   ASSESSMENT AND PLAN: We have a 64 year old African American female with end-stage renal disease on dialysis Tuesdays, Thursdays, and Saturdays. She has chronic anemia, hypertension, diabetes, and PVD status post bilateral below-knee amputations here with tibial fracture and Medicine is consulted for medical management.   For her diabetes, I would at this point start sliding scale insulin and obtain a hemoglobin A1c. I have contacted the patient's pharmacy, where she states she fills her prescriptions; however, they do not have a record of her insulin this year. and this year. She last filled insulin N 4 units in the morning and 3 units at bedtime in August. I would attempt to call the son and get more information for that.   In regards to her end-stage renal disease, and I would resume Renvela and get nephrology  consult for resumption of her dialysis. She does not appear to be volume overloaded. She is on room air and not short of breath.   In regards to her high blood pressure, it is well controlled. It is unclear if she is still on her beta blocker. However, per her pharmacy, she was on that as of late last year and I would resume that for now. Her blood pressure currently is stable.   She does have history of anemia. I would check iron studies. This likely is anemia of chronic disease.   In regards to her tibial fracture, the patient is on pain medicines. Orthopedics is following along. At this point, there are no plans for surgery. I would start her on deep vein thrombosis prophylaxis with heparin.   Thank you for the opportunity to see this patient and we will follow along with you.  ____________________________ Krystal Eaton, MD sa:slb D: 08/20/2011 13:22:55 ET T: 08/20/2011 14:41:47 ET JOB#: [id number]  cc: Krystal Eaton, MD, <Dictator> Mickel Crow, MD Clare Gandy, MD Krystal Eaton MD ELECTRONICALLY SIGNED 08/26/2011 15:06

## 2014-09-12 NOTE — Op Note (Signed)
PATIENT NAME:  Anita Fleming, Anita Fleming MR#:  [id number] DATE OF BIRTH:  [birth year]  DATE OF PROCEDURE:  11/05/2011  PREOPERATIVE DIAGNOSES:  1. Endstage renal disease.  2. Poorly functioning left arm AV graft.  3. Hypertension.  4. Diabetes.  5. Coronary artery disease.   POSTOPERATIVE DIAGNOSES:  1. Endstage renal disease.  2. Poorly functioning left arm AV graft.  3. Hypertension.  4. Diabetes.  5. Coronary artery disease.   PROCEDURES:  1. Ultrasound guidance for vascular access to left arm AV graft.  2. Left upper extremity shuntogram and central venogram.  3. Percutaneous transluminal angioplasty of two areas of in-stent restenosis within the AV graft midportion with an 8-mm diameter angioplasty balloon.   SURGEON: Anita Fleming, M.D.   ANESTHESIA: Local with moderate conscious sedation.   ESTIMATED BLOOD LOSS: Minimal.   INDICATION FOR PROCEDURE: 64 year old  African American female with end-stage renal disease. Her access had been functioning poorly with poor flows over the past couple of treatments. She has had multiple previous interventions. The risks and benefits were discussed for shuntogram and informed consent was obtained.   DESCRIPTION OF PROCEDURE: The patient was brought to the vascular interventional radiology suite. The left upper extremity was sterilely prepped and draped and a sterile surgical field was created. The graft was accessed just beyond the arterial anastomosis with a micropuncture needle under direct ultrasound guidance and a permanent image was recorded. A micropuncture wire and sheath were placed. Imaging performed through this sheath showed two areas in the 60 to 70% stenosis range within the first of three stents within the AV graft. This was in the midportion of the graft. The remainder of the central venous circulation was a little bit difficult to opacify, but on later imaging she had some left innominate superior vena cava stents  that were patent. We up-sized  to a 6 [place] sheath, gave 2500 units of intravenous heparin and crossed these areas of stenosis with a Magic torque wire without difficulty and treated these areas with an 8-mm diameter angioplasty balloon. A waists were taken in both locations. This was done with a single inflation as these were only 3-4 cm apart. With this, completion angiogram showed these areas now to be widely patent without significant residual stenosis and I elected to terminate the procedure. The sheaths were removed around a 4-0 Monocryl pursestring suture. Pressure was held. Sterile dressing was placed.    ____________________________ Anita NeedyJason S. Iban Utz, MD jsd:bjt D: 11/05/2011 11:32:42 ET T: 11/05/2011 12:05:46 ET JOB#: [id number]  cc: Anita NeedyJason S. Tekesha Almgren, MD, <Dictator> Anita NeedyJASON S Mumtaz Lovins MD ELECTRONICALLY SIGNED 11/08/2011 9:06

## 2014-12-01 NOTE — Op Note (Signed)
PATIENT NAME:  Anita Fleming, Anita Fleming MR#:  [id number] DATE OF BIRTH:  1951-01-11  DATE OF PROCEDURE:  01/24/2012  PREOPERATIVE DIAGNOSES:  1. Endstage renal disease.  2. Status post excision of infected AV graft. 3. Hypertension.  4. Superior vena cava syndrome  5. Diabetes.   POSTOPERATIVE DIAGNOSES:    1. Endstage renal disease.  2. Status post excision of infected AV graft. 3. Hypertension.  4. Superior vena cava syndrome  5. Diabetes.   PROCEDURES:  1. Ultrasound guidance for vascular access, left femoral vein.  2. Fluoroscopic guidance for placement of catheter.  3. Placement of a 50 cm tip to cuff tunneled hemodialysis catheter, left femoral vein.   SURGEON: Annice NeedyJason S. Dew, M.D.   ANESTHESIA: Local with moderate conscious sedation.   ESTIMATED BLOOD LOSS: Approximately 25 mL. FLUOROSCOPY TIME: Approximately one minute.  CONTRAST USED: None.   INDICATION FOR PROCEDURE: This is a 64 year old African American female with long-standing end-stage renal disease. She has a right jugular innominate occlusion. She had stents from her left subclavian vein and superior vena cava from previous superior vena cava syndrome. She had infection of her left arm AV graft and this had to be excised and she will require a PermCath for permanent dialysis access. She has a temporary catheter in the right groin, so her left femoral vein will be used for her dialysis needs.   DESCRIPTION OF PROCEDURE:   The patient is brought to the vascular interventional radiology suite. The left groin was sterilely prepped and draped, and a sterile surgical field was created. The left femoral vein was visualized with ultrasound and found to be widely patent. It was then accessed under direct ultrasound guidance without difficulty with a Seldinger needle and a J-wire was placed. After skin and dilatation, the peel-away sheath was placed over the wire. I then anesthetized an area several inches inferior and lateral to the access  incision, tunneled from this counter incision to the access site, selected a 50 cm tip to cuff tunneled hemodialysis catheter using fluoroscopic guidance. I placed this over the wire and through the peel-away sheath, removing the sheath and then the wire. The catheter tips were parked in the retrohepatic vena cava and up near the right atrium with the more distal tip. There were no kinks in the catheter. The appropriate distal connectors were placed. It withdrew blood well and flushed easily with heparinized saline and the concentrated heparin solution was then placed. It was secured to the skin with two Prolene sutures. The access incision was closed with a single 4-0 Monocryl and a single 4-0 Monocryl pursestring suture was placed around the exit site. Sterile dressing was placed. The patient tolerated the procedure well.    ____________________________ Annice NeedyJason S. Dew, MD jsd:ap D: 01/24/2012 11:11:24 ET T: 01/24/2012 12:19:40 ET JOB#: [id number]  cc: Annice NeedyJason S. Dew, MD, <Dictator>  Annice NeedyJASON S DEW MD ELECTRONICALLY SIGNED 01/27/2012 14:42
